# Patient Record
Sex: Male | Born: 1970 | Race: Black or African American | Hispanic: No | Marital: Single
Health system: Southern US, Community
[De-identification: ages and names within clinical notes are randomized; demographics above are authoritative.]

## PROBLEM LIST (undated history)

## (undated) DIAGNOSIS — R569 Unspecified convulsions: Secondary | ICD-10-CM

---

## 2008-09-13 LAB — STREP AG SCREEN, GROUP A: Group A Strep Ag ID: NEGATIVE

## 2008-09-13 MED ORDER — AZITHROMYCIN 250 MG TAB
250 mg | PACK | ORAL | Status: AC
Start: 2008-09-13 — End: 2008-09-18

## 2008-09-13 MED ORDER — CODEINE-GUAIFENESIN 10 MG-100 MG/5 ML ORAL LIQUID
100-10 mg/5 mL | Freq: Three times a day (TID) | ORAL | Status: DC | PRN
Start: 2008-09-13 — End: 2009-02-15

## 2008-09-13 NOTE — ED Notes (Signed)
Report to Terry Verdin, RN.

## 2008-09-13 NOTE — ED Notes (Signed)
I have reviewed discharge instructions with the patient.  The patient verbalized understanding. To follow up with primary physician and return with worsening.

## 2008-09-13 NOTE — ED Provider Notes (Signed)
Patient is a 38 y.o. male presenting with pharyngitis. The history is provided by the patient.   Sore Throat   This is a new problem. The current episode started more than 2 days ago. The problem has not changed since onset. There has been no fever. Associated symptoms include congestion and cough. Pertinent negatives include no diarrhea, no vomiting, no drooling, no ear discharge, no ear pain, no headaches, no plugged ear sensation, no shortness of breath, no stridor, no swollen glands, no trouble swallowing and no stiff neck. He has had no exposure to strep and no exposure to mono. He has tried nothing for the symptoms.        No past medical history on file.     No past surgical history on file.      No family history on file.     History   Social History   ??? Marital Status: Single     Spouse Name: N/A     Number of Children: N/A   ??? Years of Education: N/A   Occupational History   ??? Not on file.   Social History Main Topics   ??? Tobacco Use: Yes   ??? Alcohol Use: Yes   ??? Drug Use: No   ??? Sexually Active:    Other Topics Concern   ??? Not on file   Social History Narrative   ??? No narrative on file           ALLERGIES: Review of patient's allergies indicates no known allergies.      Review of Systems   Constitutional: Negative.  Negative for fever and chills.   HENT: Positive for congestion, sore throat and rhinorrhea. Negative for ear pain, facial swelling, drooling, mouth sores, trouble swallowing, neck pain, neck stiffness and ear discharge.    Eyes: Negative.    Respiratory: Positive for cough. Negative for shortness of breath, wheezing and stridor.    Cardiovascular: Negative.  Negative for chest pain, palpitations and leg swelling.   Gastrointestinal: Negative.  Negative for nausea, vomiting, abdominal pain and diarrhea.   Genitourinary: Negative.    Musculoskeletal: Negative.    Skin: Negative.    Neurological: Negative.  Negative for headaches.   All other systems reviewed and are negative.         Filed Vitals:    09/13/2008  9:21 AM   BP: 123/72   Pulse: 105   Temp: 98.1 ??F (36.7 ??C)   Resp: 18   Height: 5\' 8"  (1.727 m)   Weight: 163 lb (73.936 kg)   SpO2: 96%              Physical Exam   Nursing note and vitals reviewed.  Constitutional: He is oriented. He appears well-developed and well-nourished. No distress.   HENT:   Head: Normocephalic and atraumatic.   Right Ear: External ear normal.   Left Ear: External ear normal.   Nose: Rhinorrhea present.   Mouth/Throat: Mucous membranes are normal. Posterior oropharyngeal erythema present. No oropharyngeal exudate, posterior oropharyngeal edema or tonsillar abscesses.   Cardiovascular: Normal rate, regular rhythm, normal heart sounds and intact distal pulses.    Pulmonary/Chest: Effort normal and breath sounds normal. No respiratory distress. He has no wheezes. He has no rales.   Abdominal: Soft. Bowel sounds are normal. No tenderness.   Musculoskeletal: Normal range of motion.   Neurological: He is alert and oriented.   Skin: Skin is warm and dry. He is not diaphoretic.   Psychiatric: He has  a normal mood and affect. His behavior is normal.            MDM Coding   Reviewed: nursing note and vitals  Interpretation: labs          Procedures

## 2008-09-15 LAB — CULTURE, STREP THROAT

## 2009-02-15 MED ORDER — PROPOXYPHENE N-ACETAMINOPHEN 100 MG-650 MG TAB
100-650 mg | ORAL_TABLET | Freq: Four times a day (QID) | ORAL | Status: AC | PRN
Start: 2009-02-15 — End: 2009-02-22

## 2009-02-15 MED ORDER — CYCLOBENZAPRINE 10 MG TAB
10 mg | ORAL_TABLET | Freq: Three times a day (TID) | ORAL | Status: AC | PRN
Start: 2009-02-15 — End: 2009-02-25

## 2009-02-15 MED ADMIN — ketorolac tromethamine (TORADOL) 60 mg/2 mL injection 60 mg: INTRAMUSCULAR | @ 21:00:00 | NDC 63323016202

## 2009-02-15 MED ADMIN — cyclobenzaprine (FLEXERIL) tablet 10 mg: ORAL | @ 21:00:00 | NDC 00591565801

## 2009-02-15 MED ADMIN — dexamethasone (DECADRON) injection 10 mg: INTRAMUSCULAR | @ 21:00:00 | NDC 00641036721

## 2009-02-15 MED FILL — DEXAMETHASONE SODIUM PHOSPHATE 10 MG/ML IJ SOLN: 10 mg/mL | INTRAMUSCULAR | Qty: 1

## 2009-02-15 MED FILL — CYCLOBENZAPRINE 10 MG TAB: 10 mg | ORAL | Qty: 1

## 2009-02-15 MED FILL — KETOROLAC TROMETHAMINE 60 MG/2 ML IM: 60 mg/2 mL | INTRAMUSCULAR | Qty: 2

## 2009-02-15 NOTE — ED Provider Notes (Signed)
Patient is a 38 y.o. male presenting with back pain. The history is provided by the patient. No language interpreter was used.   Back Pain   This is a new problem. The current episode started 3 to 5 hours ago (five hrs). The problem has not changed since onset. The problem occurs constantly. The pain is associated with no known injury. The pain is present in the right side and lumbar spine. The quality of the pain is described as cramping. The pain does not radiate. The pain is at a severity of 9/10. The symptoms are aggravated by certain positions, twisting and bending. The pain is the same all the time. Pertinent negatives include no fever, no headaches, no abdominal pain, no bowel incontinence, no bladder incontinence, no pelvic pain, no leg pain, no paresthesias, no tingling and no weakness. He has tried bed rest for the symptoms. The treatment provided no relief. Risk factors include a sedentary lifestyle. The patient's surgical history non-contributory        No past medical history on file.     No past surgical history on file.      No family history on file.     History   Social History   ??? Marital Status: Single     Spouse Name: N/A     Number of Children: N/A   ??? Years of Education: N/A   Occupational History   ??? Not on file.   Social History Main Topics   ??? Tobacco Use: Yes   ??? Alcohol Use: Yes   ??? Drug Use: No   ??? Sexually Active:    Other Topics Concern   ??? Not on file   Social History Narrative   ??? No narrative on file           ALLERGIES: Review of patient's allergies indicates no known allergies.      Review of Systems   Constitutional: Negative for fever.   Gastrointestinal: Negative for abdominal pain.   Genitourinary: Negative for bladder incontinence and pelvic pain.   Musculoskeletal: Positive for back pain.   Neurological: Negative for tingling, weakness and headaches.   All other systems reviewed and are negative.        Filed Vitals:    02/15/2009  4:37 PM   BP: 138/88   Pulse: 89    Temp: 96.7 ??F (35.9 ??C)   Resp: 17   Height: 5\' 9"  (1.753 m)   Weight: 165 lb (74.844 kg)              Physical Exam   Nursing note and vitals reviewed.  Constitutional: He is oriented to person, place, and time. He appears well-developed and well-nourished.   HENT:   Head: Normocephalic and atraumatic.   Eyes: Conjunctivae and extraocular motions are normal. Pupils are equal, round, and reactive to light.   Neck: Normal range of motion. Neck supple.   Cardiovascular: Normal rate, regular rhythm, normal heart sounds and intact distal pulses.    Pulmonary/Chest: Effort normal and breath sounds normal.   Abdominal: Soft.   Musculoskeletal: He exhibits tenderness.        Lumbar back: He exhibits tenderness and spasm.   Neurological: He is alert and oriented to person, place, and time.   Skin: Skin is warm and dry.   Psychiatric: He has a normal mood and affect. His behavior is normal. Judgment and thought content normal.        Coding    Procedures

## 2009-02-15 NOTE — ED Notes (Signed)
Medication administered as ordered. Pt tolerated ok.

## 2009-02-17 NOTE — ED Notes (Signed)
I had no direct involvement in the care of this patient.  This signature is to meet administrative requirements only.

## 2009-02-17 NOTE — ED Notes (Signed)
Pt had x-ray, returned to room, family at bedside, waiting for final disposition

## 2009-02-17 NOTE — ED Notes (Signed)
Pt states that he has been having hiccups at work until he feels like he is losing his breath.  Pt noted not to have them at this time.

## 2009-02-17 NOTE — ED Provider Notes (Signed)
Patient is a 38 y.o. male presenting with hiccups. The history is provided by the patient.   Hiccups  This is a new problem. The current episode started 2 days ago. The problem occurs constantly. The problem has not changed since onset. Pertinent negatives include no chest pain, no abdominal pain, no headaches and no shortness of breath. chest wall pain that increases with hiccups . Exacerbated by: hiccups. Nothing relieves the symptoms. He has tried nothing for the symptoms.        No past medical history on file.     No past surgical history on file.      No family history on file.     History   Social History   ??? Marital Status: Married     Spouse Name: N/A     Number of Children: N/A   ??? Years of Education: N/A   Occupational History   ??? Not on file.   Social History Main Topics   ??? Tobacco Use: Yes -- 1.5 packs/day   ??? Alcohol Use: Yes   ??? Drug Use: No   ??? Sexually Active:    Other Topics Concern   ??? Not on file   Social History Narrative   ??? No narrative on file           ALLERGIES: Review of patient's allergies indicates no known allergies.      Review of Systems   Constitutional: Negative.    HENT: Negative.         Hiccups   Eyes: Negative.    Respiratory: Negative.  Negative for shortness of breath.    Cardiovascular: Negative.  Negative for chest pain.   Gastrointestinal: Negative.  Negative for abdominal pain.   Genitourinary: Negative.    Musculoskeletal: Negative.    Skin: Negative.    Neurological: Negative.  Negative for headaches.   Hematological: Negative.    Psychiatric/Behavioral: Negative.        Filed Vitals:    02/17/2009  9:50 PM   BP: 139/104   Pulse: 93   Temp: 99 ??F (37.2 ??C)   Resp: 18   Height: 5\' 9"  (1.753 m)   Weight: 165 lb (74.844 kg)   SpO2: 99%              Physical Exam   Constitutional: He is oriented to person, place, and time. He appears well-developed and well-nourished.   HENT:   Head: Normocephalic and atraumatic.    Eyes: Conjunctivae and extraocular motions are normal. Pupils are equal, round, and reactive to light.   Neck: Normal range of motion. Neck supple.   Cardiovascular: Normal rate, regular rhythm and normal heart sounds.    Pulmonary/Chest: Effort normal and breath sounds normal.       Abdominal: Soft. Bowel sounds are normal.   Musculoskeletal: Normal range of motion.   Neurological: He is alert and oriented to person, place, and time. He has normal reflexes.   Psychiatric: He has a normal mood and affect. His behavior is normal. Judgment and thought content normal.        MDM Coding   Reviewed: nursing note and vitals  Interpretation: labs and x-ray        Procedures    CXR: wnl  Results Include:    Recent Results (from the past 24 hour(s))   CBC WITH AUTOMATED DIFF    Collection Time 02/17/09 10:39 PM   Component Value Range   ??? WBC 6.6  4.0 - 10.5 (K/uL)   ???  RBC 4.48  4.27 - 5.68 (M/uL)   ??? HGB 14.5  12.8 - 16.5 (g/dL)   ??? HCT 41.9  40.4 - 52.1 (%)   ??? MCV 93.5  79.6 - 97.8 (FL)   ??? MCH 32.4  26.1 - 32.9 (PG)   ??? MCHC 34.6  31.4 - 35.0 (g/dL)   ??? RDW 12.8  11.9 - 14.6 (%)   ??? PLATELET 196  140 - 440 (K/uL)   ??? MPV 10.3 (*) 10.8 - 14.1 (FL)   ??? DF AUTOMATED     ??? NEUTROPHILS 45  43 - 78 (%)   ??? LYMPHOCYTES 44  13 - 44 (%)   ??? MONOCYTES 10  4.0 - 12.0 (%)   ??? EOSINOPHILS 1  0.5 - 7.8 (%)   ??? BASOPHILS 0  0.0 - 2.0 (%)   ??? ABSOLUTE NEUTS 3.0  1.7 - 8.2 (K/UL)   ??? ABSOLUTE LYMPHS 2.9  0.5 - 4.6 (K/UL)   ??? ABSOLUTE MONOS 0.6  0.1 - 1.3 (K/UL)   ??? ABSOLUTE EOSINS 0.0  0.0 - 0.8 (K/UL)   ??? ABSOLUTE BASOS 0.0  0.0 - 0.2 (K/UL)   ??? IMM. GRANS. 0.3  0.0 - 2.0 (%)   ??? ABS. IMM. GRANS. 0.0  0.0 - 2.0 (K/UL)   METABOLIC PANEL, COMPREHENSIVE    Collection Time 02/17/09 10:39 PM   Component Value Range   ??? Sodium 144  136 - 145 (MMOL/L)   ??? Potassium 3.5  3.5 - 5.1 (MMOL/L)   ??? Chloride 104  98 - 107 (MMOL/L)   ??? CO2 27  21 - 32 (MMOL/L)   ??? Anion gap 13  7 - 16 (mmol/L)   ??? Glucose 89  74 - 106 (MG/DL)   ??? BUN 8  7 - 18 (MG/DL)    ??? Creatinine 1.1 (*) 0.6 - 1.0 (MG/DL)   ??? GFR est AA >60  >60 (ml/min/1.75m2)   ??? GFR est non-AA >60  >60 (ml/min/1.50m2)   ??? Calcium 8.6  8.4 - 10.4 (MG/DL)   ??? Bilirubin, total 0.3  0.2 - 1.1 (MG/DL)   ??? ALT 78 (*) 39 - 65 (U/L)   ??? AST 41 (*) 15 - 37 (U/L)   ??? Alk. phosphatase 101  50 - 136 (U/L)   ??? Protein, total 7.2  6.3 - 8.2 (g/dL)   ??? Albumin 3.6  3.5 - 5.0 (g/dL)   ??? Globulin 3.6 (*) 2.3 - 3.5 (g/dL)   ??? A-G Ratio 1.0 (*) 1.2 - 3.5 ( )   LIPASE    Collection Time 02/17/09 10:39 PM   Component Value Range   ??? Lipase 241  73 - 393 (U/L)         Pt given reglan  Pt states full relief of sx's    I have discussed the results of labs, procedures, radiographs, treatments as well as any previous results found within the [facility] [employer] with the patient and available family.?? A treatment plan was developed in conjunction with the patient and was agreed upon. The patient is ready for discharge at this time.?? All voiced understanding of the discharge plan and medication instructions or changes as appropriate.?? Questions about treatment in the ED were answered.?? The patient was encouraged to return should symptoms worsen or new problems develop. A follow up physician was provided to the patient on the discharge papers.

## 2009-02-17 NOTE — ED Notes (Signed)
I have reviewed discharge instructions with the patient.  The patient verbalized understanding.

## 2009-02-18 LAB — METABOLIC PANEL, COMPREHENSIVE
A-G Ratio: 1 — ABNORMAL LOW (ref 1.2–3.5)
ALT (SGPT): 78 U/L — ABNORMAL HIGH (ref 39–65)
AST (SGOT): 41 U/L — ABNORMAL HIGH (ref 15–37)
Albumin: 3.6 g/dL (ref 3.5–5.0)
Alk. phosphatase: 101 U/L (ref 50–136)
Anion gap: 13 mmol/L (ref 7–16)
BUN: 8 MG/DL (ref 7–18)
Bilirubin, total: 0.3 MG/DL (ref 0.2–1.1)
CO2: 27 MMOL/L (ref 21–32)
Calcium: 8.6 MG/DL (ref 8.4–10.4)
Chloride: 104 MMOL/L (ref 98–107)
Creatinine: 1.1 MG/DL — ABNORMAL HIGH (ref 0.6–1.0)
GFR est AA: >60 mL/min/{1.73_m2} (ref >60)
GFR est non-AA: >60 mL/min/{1.73_m2} (ref >60)
Globulin: 3.6 g/dL — ABNORMAL HIGH (ref 2.3–3.5)
Glucose: 89 MG/DL (ref 74–106)
Potassium: 3.5 MMOL/L (ref 3.5–5.1)
Protein, total: 7.2 g/dL (ref 6.3–8.2)
Sodium: 144 MMOL/L (ref 136–145)

## 2009-02-18 LAB — CBC WITH AUTOMATED DIFF
ABS. BASOPHILS: 0 10*3/uL (ref 0.0–0.2)
ABS. EOSINOPHILS: 0 10*3/uL (ref 0.0–0.8)
ABS. IMM. GRANS.: 0 10*3/uL (ref 0.0–2.0)
ABS. LYMPHOCYTES: 2.9 10*3/uL (ref 0.5–4.6)
ABS. MONOCYTES: 0.6 10*3/uL (ref 0.1–1.3)
ABS. NEUTROPHILS: 3 10*3/uL (ref 1.7–8.2)
BASOPHILS: 0 % (ref 0.0–2.0)
EOSINOPHILS: 1 % (ref 0.5–7.8)
HCT: 41.9 % (ref 40.4–52.1)
HGB: 14.5 g/dL (ref 12.8–16.5)
IMMATURE GRANULOCYTES: 0.3 % (ref 0.0–2.0)
LYMPHOCYTES: 44 % (ref 13–44)
MCH: 32.4 PG (ref 26.1–32.9)
MCHC: 34.6 g/dL (ref 31.4–35.0)
MCV: 93.5 FL (ref 79.6–97.8)
MONOCYTES: 10 % (ref 4.0–12.0)
MPV: 10.3 FL — ABNORMAL LOW (ref 10.8–14.1)
NEUTROPHILS: 45 % (ref 43–78)
PLATELET: 196 10*3/uL (ref 140–440)
RBC: 4.48 M/uL (ref 4.27–5.68)
RDW: 12.8 % (ref 11.9–14.6)
WBC: 6.6 10*3/uL (ref 4.0–10.5)

## 2009-02-18 LAB — LIPASE: Lipase: 241 U/L (ref 73–393)

## 2009-02-18 MED ORDER — METOCLOPRAMIDE 10 MG TAB
10 mg | ORAL_TABLET | Freq: Four times a day (QID) | ORAL | Status: AC | PRN
Start: 2009-02-18 — End: 2009-02-27

## 2009-02-18 MED ADMIN — metoclopramide (REGLAN) tablet 10 mg: ORAL | @ 03:00:00 | NDC 63739017201

## 2009-02-18 MED FILL — METOCLOPRAMIDE 10 MG TAB: 10 mg | ORAL | Qty: 1

## 2009-11-04 NOTE — ED Notes (Signed)
Patient would not wait to be seen even after he was told MD was coming to see him and had already clicked on chart to come see him next.  Patient walking without any difficulty when he left.

## 2009-11-04 NOTE — ED Notes (Signed)
C/o right knee pain. Seen at [facility] for same and prescribed naprosyn. States naprosyn not relieving pain.

## 2012-02-08 NOTE — ED Notes (Signed)
I have reviewed discharge instructions with the patient.  The patient verbalized understanding.
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
## 2012-02-08 NOTE — ED Provider Notes (Signed)
I was personally available for consultation in the emergency department.  I have reviewed the chart and agree with the documentation recorded by the MLP, including the assessment, treatment plan, and disposition.  Neyland Pettengill M Jasson Siegmann, MD
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
## 2012-02-09 MED ORDER — HYDROCODONE-ACETAMINOPHEN 5 MG-325 MG TAB
5-325 mg | ORAL_TABLET | Freq: Two times a day (BID) | ORAL | Status: AC
Start: 2012-02-09 — End: 2012-02-13

## 2012-02-09 MED ORDER — METHOCARBAMOL 750 MG TAB
750 mg | ORAL_TABLET | Freq: Four times a day (QID) | ORAL | Status: AC
Start: 2012-02-09 — End: 2012-02-13

## 2012-02-09 MED ADMIN — traMADol (ULTRAM) tablet 50 mg: ORAL | @ 03:00:00 | NDC 62584055911

## 2012-02-09 MED FILL — TRAMADOL 50 MG TAB: 50 mg | ORAL | Qty: 1

## 2016-01-24 DIAGNOSIS — L089 Local infection of the skin and subcutaneous tissue, unspecified: Secondary | ICD-10-CM

## 2016-01-24 NOTE — ED Notes (Signed)
I have reviewed discharge instructions with the patient.  The patient verbalized understanding.

## 2016-01-24 NOTE — ED Provider Notes (Addendum)
Patient is a 45 y.o. male presenting with finger pain. The history is provided by the patient.   Finger Pain    This is a new problem. The current episode started more than 1 week ago. The problem occurs constantly. The problem has been gradually worsening. The pain is present in the left fingers and left upper leg. The pain is at a severity of 0/10. The patient is experiencing no pain. He has tried nothing for the symptoms. There has been a history of trauma (table saw injury).        History reviewed. No pertinent past medical history.    History reviewed. No pertinent surgical history.      History reviewed. No pertinent family history.    Social History     Social History   ??? Marital status: MARRIED     Spouse name: N/A   ??? Number of children: N/A   ??? Years of education: N/A     Occupational History   ??? Not on file.     Social History Main Topics   ??? Smoking status: Current Every Day Smoker     Packs/day: 1.00     Years: 20.00   ??? Smokeless tobacco: Not on file   ??? Alcohol use Yes      Comment: social- a lot of Vodka tonight   ??? Drug use: No   ??? Sexual activity: Yes     Partners: Female     Other Topics Concern   ??? Not on file     Social History Narrative         ALLERGIES: Review of patient's allergies indicates no known allergies.    Review of Systems   Constitutional: Negative.    HENT: Negative.    Respiratory: Negative.    Cardiovascular: Negative.    Gastrointestinal: Negative.    Endocrine: Negative.    Genitourinary: Negative.    Musculoskeletal: Negative.    Skin: Negative.    Neurological: Negative.    Hematological: Negative.        Vitals:    01/24/16 2155   BP: 152/73   Pulse: (!) 113   Resp: 20   Temp: 98.5 ??F (36.9 ??C)   SpO2: 97%   Weight: 74.8 kg (165 lb)   Height: 5\' 9"  (1.753 m)            Physical Exam   Constitutional: He is oriented to person, place, and time. He appears well-developed and well-nourished.   HENT:   Head: Normocephalic and atraumatic.    Cardiovascular: Intact distal pulses.    Pulmonary/Chest: Effort normal.   Abdominal: Soft.   Neurological: He is alert and oriented to person, place, and time.   Skin: Skin is warm and dry.   Psychiatric: He has a normal mood and affect. His behavior is normal.   Nursing note and vitals reviewed.       MDM  Number of Diagnoses or Management Options  Finger infection: new and requires workup  Diagnosis management comments: Patient has a healing area over the lateral aspect of the left fifth digit.  He states that he was cut initially by a table saw and cleaned it copiously with water and hydrogen peroxide.  In the last 24-48 hours he has noticed some yellow drainage from the scab was concerned about becoming infected.  The digit has full range of motion with no pain.  There is minimal drainage present.  No swelling noted.  Discussed with patient due to  the potential contamination from a table saw, which are placed on metabolic therapy and should continue to clean daily and use antibiotic over the wound and not use any more peroxide or volatile agents to dry the wound as this may delay healing.    ED Course       Procedures
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
## 2016-01-25 ENCOUNTER — Inpatient Hospital Stay
Admit: 2016-01-25 | Discharge: 2016-01-25 | Disposition: A | Discharge status: Home / Self Care | Payer: Self-pay | Attending: Emergency Medicine

## 2016-01-25 MED ORDER — DIPHTH,PERTUS(AC)TETANUS VAC(PF) 2.5 LF UNIT-8 MCG-5 LF/0.5 ML INJ
INTRAMUSCULAR | Status: AC
Start: 2016-01-25 — End: 2016-01-24
  Administered 2016-01-25: 04:00:00 via INTRAMUSCULAR

## 2016-01-25 MED ORDER — CEPHALEXIN 500 MG CAP
500 mg | ORAL | Status: AC
Start: 2016-01-25 — End: 2016-01-24
  Administered 2016-01-25: 04:00:00 via ORAL

## 2016-01-25 MED ORDER — CEPHALEXIN 500 MG CAP
500 mg | ORAL_CAPSULE | Freq: Four times a day (QID) | ORAL | 0 refills | Status: AC
Start: 2016-01-25 — End: 2016-01-31

## 2016-01-25 MED FILL — CEPHALEXIN 500 MG CAP: 500 mg | ORAL | Qty: 1

## 2016-01-25 MED FILL — BOOSTRIX TDAP 2.5 LF UNIT-8 MCG-5 LF/0.5 ML INTRAMUSCULAR SUSPENSION: INTRAMUSCULAR | Qty: 1

## 2018-08-05 ENCOUNTER — Inpatient Hospital Stay
Admit: 2018-08-05 | Discharge: 2018-08-05 | Disposition: A | Discharge status: Home / Self Care | Payer: Self-pay | Attending: Emergency Medicine

## 2018-08-05 ENCOUNTER — Emergency Department: Admit: 2018-08-05 | Payer: Self-pay

## 2018-08-05 DIAGNOSIS — R569 Unspecified convulsions: Secondary | ICD-10-CM

## 2018-08-05 LAB — CBC WITH AUTO DIFFERENTIAL
Basophils %: 1 % (ref 0.0–2.0)
Basophils Absolute: 0 10*3/uL (ref 0.0–0.2)
Eosinophils %: 2 % (ref 0.5–7.8)
Eosinophils Absolute: 0.1 10*3/uL (ref 0.0–0.8)
Granulocyte Absolute Count: 0 10*3/uL (ref 0.0–0.5)
Hematocrit: 43.5 % (ref 41.1–50.3)
Hemoglobin: 14.8 g/dL (ref 13.6–17.2)
Immature Granulocytes: 1 % (ref 0.0–5.0)
Lymphocytes %: 34 % (ref 13–44)
Lymphocytes Absolute: 1.4 10*3/uL (ref 0.5–4.6)
MCH: 34.2 PG — ABNORMAL HIGH (ref 26.1–32.9)
MCHC: 34 g/dL (ref 31.4–35.0)
MCV: 100.5 FL — ABNORMAL HIGH (ref 79.6–97.8)
MPV: 9.7 FL (ref 9.4–12.3)
Monocytes %: 12 % (ref 4.0–12.0)
Monocytes Absolute: 0.5 10*3/uL (ref 0.1–1.3)
NRBC Absolute: 0 10*3/uL (ref 0.0–0.2)
Neutrophils %: 51 % (ref 43–78)
Neutrophils Absolute: 2.1 10*3/uL (ref 1.7–8.2)
Platelets: 212 10*3/uL (ref 150–450)
RBC: 4.33 M/uL (ref 4.23–5.6)
RDW: 12.2 % (ref 11.9–14.6)
WBC: 4.1 10*3/uL — ABNORMAL LOW (ref 4.3–11.1)

## 2018-08-05 LAB — DRUG SCREEN, URINE
AMPHETAMINES: NEGATIVE
Amphetamine Screen, Urine: NEGATIVE
BARBITURATES: NEGATIVE
BENZODIAZEPINES: NEGATIVE
Barbiturate Screen, Urine: NEGATIVE
Benzodiazepine Screen, Urine: NEGATIVE
COCAINE: NEGATIVE
Cocaine Screen Urine: NEGATIVE
METHADONE: NEGATIVE
Methadone Screen, Urine: NEGATIVE
OPIATES: NEGATIVE
Opiate Screen, Urine: NEGATIVE
PCP Screen, Urine: NEGATIVE
PCP(PHENCYCLIDINE): NEGATIVE
THC (TH-CANNABINOL): NEGATIVE
THC Screen, Urine: NEGATIVE

## 2018-08-05 LAB — COMPREHENSIVE METABOLIC PANEL
ALT: 152 U/L — ABNORMAL HIGH (ref 12–65)
AST: 156 U/L — ABNORMAL HIGH (ref 15–37)
Albumin/Globulin Ratio: 0.8 — ABNORMAL LOW (ref 1.2–3.5)
Albumin: 3.7 g/dL (ref 3.5–5.0)
Alkaline Phosphatase: 146 U/L — ABNORMAL HIGH (ref 50–136)
Anion Gap: 11 mmol/L (ref 7–16)
BUN: 12 MG/DL (ref 6–23)
CO2: 27 mmol/L (ref 21–32)
Calcium: 9.5 MG/DL (ref 8.3–10.4)
Chloride: 102 mmol/L (ref 98–107)
Creatinine: 0.95 MG/DL (ref 0.8–1.5)
EGFR IF NonAfrican American: >60 mL/min/{1.73_m2} (ref >60)
GFR African American: >60 mL/min/{1.73_m2} (ref >60)
Globulin: 4.8 g/dL — ABNORMAL HIGH (ref 2.3–3.5)
Glucose: 86 mg/dL (ref 65–100)
Potassium: 3.8 mmol/L (ref 3.5–5.1)
Sodium: 140 mmol/L (ref 136–145)
Total Bilirubin: 1.2 MG/DL — ABNORMAL HIGH (ref 0.2–1.1)
Total Protein: 8.5 g/dL — ABNORMAL HIGH (ref 6.3–8.2)

## 2018-08-05 LAB — EKG 12-LEAD
Atrial Rate: 86 {beats}/min
P Axis: 70 degrees
P-R Interval: 144 ms
Q-T Interval: 370 ms
QRS Duration: 94 ms
QTc Calculation (Bazett): 442 ms
R Axis: 57 degrees
T Axis: 43 degrees
Ventricular Rate: 86 {beats}/min

## 2018-08-05 LAB — ETHYL ALCOHOL
ALCOHOL(ETHYL),SERUM: <3 MG/DL
Ethyl Alcohol: <3 MG/DL

## 2018-08-05 LAB — MAGNESIUM
Magnesium: 1.9 mg/dL (ref 1.8–2.4)
Magnesium: 1.9 mg/dL (ref 1.8–2.4)

## 2018-08-05 LAB — CBC WITH AUTOMATED DIFF
ABS. BASOPHILS: 0 10*3/uL (ref 0.0–0.2)
ABS. EOSINOPHILS: 0.1 10*3/uL (ref 0.0–0.8)
ABS. IMM. GRANS.: 0 10*3/uL (ref 0.0–0.5)
ABS. LYMPHOCYTES: 1.4 10*3/uL (ref 0.5–4.6)
ABS. MONOCYTES: 0.5 10*3/uL (ref 0.1–1.3)
ABS. NEUTROPHILS: 2.1 10*3/uL (ref 1.7–8.2)
ABSOLUTE NRBC: 0 10*3/uL (ref 0.0–0.2)
BASOPHILS: 1 % (ref 0.0–2.0)
EOSINOPHILS: 2 % (ref 0.5–7.8)
HCT: 43.5 % (ref 41.1–50.3)
HGB: 14.8 g/dL (ref 13.6–17.2)
IMMATURE GRANULOCYTES: 1 % (ref 0.0–5.0)
LYMPHOCYTES: 34 % (ref 13–44)
MCH: 34.2 PG — ABNORMAL HIGH (ref 26.1–32.9)
MCHC: 34 g/dL (ref 31.4–35.0)
MCV: 100.5 FL — ABNORMAL HIGH (ref 79.6–97.8)
MONOCYTES: 12 % (ref 4.0–12.0)
MPV: 9.7 FL (ref 9.4–12.3)
NEUTROPHILS: 51 % (ref 43–78)
PLATELET: 212 10*3/uL (ref 150–450)
RBC: 4.33 M/uL (ref 4.23–5.6)
RDW: 12.2 % (ref 11.9–14.6)
WBC: 4.1 10*3/uL — ABNORMAL LOW (ref 4.3–11.1)

## 2018-08-05 LAB — EKG, 12 LEAD, INITIAL
Atrial Rate: 86 {beats}/min
Calculated P Axis: 70 degrees
Calculated R Axis: 57 degrees
Calculated T Axis: 43 degrees
P-R Interval: 144 ms
Q-T Interval: 370 ms
QRS Duration: 94 ms
QTC Calculation (Bezet): 442 ms
Ventricular Rate: 86 {beats}/min

## 2018-08-05 LAB — METABOLIC PANEL, COMPREHENSIVE
A-G Ratio: 0.8 — ABNORMAL LOW (ref 1.2–3.5)
ALT (SGPT): 152 U/L — ABNORMAL HIGH (ref 12–65)
AST (SGOT): 156 U/L — ABNORMAL HIGH (ref 15–37)
Albumin: 3.7 g/dL (ref 3.5–5.0)
Alk. phosphatase: 146 U/L — ABNORMAL HIGH (ref 50–136)
Anion gap: 11 mmol/L (ref 7–16)
BUN: 12 MG/DL (ref 6–23)
Bilirubin, total: 1.2 MG/DL — ABNORMAL HIGH (ref 0.2–1.1)
CO2: 27 mmol/L (ref 21–32)
Calcium: 9.5 MG/DL (ref 8.3–10.4)
Chloride: 102 mmol/L (ref 98–107)
Creatinine: 0.95 MG/DL (ref 0.8–1.5)
GFR est AA: >60 mL/min/{1.73_m2} (ref >60)
GFR est non-AA: >60 mL/min/{1.73_m2} (ref >60)
Globulin: 4.8 g/dL — ABNORMAL HIGH (ref 2.3–3.5)
Glucose: 86 mg/dL (ref 65–100)
Potassium: 3.8 mmol/L (ref 3.5–5.1)
Protein, total: 8.5 g/dL — ABNORMAL HIGH (ref 6.3–8.2)
Sodium: 140 mmol/L (ref 136–145)

## 2018-08-05 MED ORDER — LEVETIRACETAM 500 MG TAB
500 mg | ORAL | Status: AC
Start: 2018-08-05 — End: 2018-08-05
  Administered 2018-08-05: 12:00:00 via ORAL

## 2018-08-05 MED ORDER — SODIUM CHLORIDE 0.9 % IJ SYRG
INTRAMUSCULAR | Status: DC | PRN
Start: 2018-08-05 — End: 2018-08-05

## 2018-08-05 MED ORDER — SODIUM CHLORIDE 0.9 % IJ SYRG
Freq: Three times a day (TID) | INTRAMUSCULAR | Status: DC
Start: 2018-08-05 — End: 2018-08-05

## 2018-08-05 MED ORDER — LEVETIRACETAM 500 MG TAB
500 mg | ORAL_TABLET | Freq: Two times a day (BID) | ORAL | 1 refills | Status: AC
Start: 2018-08-05 — End: ?

## 2018-08-05 MED FILL — LEVETIRACETAM 500 MG TAB: 500 mg | ORAL | Qty: 1

## 2018-08-05 NOTE — ED Provider Notes (Signed)
Per nurse's notes: "Pt's sister states to ems that she heard pt fall and then witnessed him having tonic-clonic seizure activity. Pt daily etoh drinker. No hx of seizures however states it runs in their family. Bruising to left side of forehead, has bitten tongue, abrasion to r hand. Oriented to person and place but not year."    The history is provided by the patient and the EMS personnel. The history is limited by the condition of the patient.   Seizure    This is a new problem. The current episode started less than 1 hour ago. The problem has been resolved. There was 1 seizure. Duration: Unknown. Pertinent negatives include no confusion, no headaches, no speech difficulty, no visual disturbance, no neck stiffness, no sore throat, no chest pain, no cough, no vomiting, no diarrhea and no muscle weakness. Characteristics include rhythmic jerking, loss of consciousness and bit tongue. Characteristics do not include eye blinking, eye deviation, bowel incontinence, bladder incontinence, apnea or cyanosis. The episode was witnessed. There was no sensation of an aura present.  The seizures did not continue in the ED. The seizure(s) had no focality. Possible causes do not include medication or dosage change, sleep deprivation, missed seizure meds, recent illness, change in alcohol use, stress, head injury or missed seizure meds. There has been no fever.  He reports no chest pain, no confusion, no visual disturbance, no diarrhea, no vomiting, no headaches, no sore throat, no muscle weakness, no stiff neck, no speech difficulty, and no cough. There were no medications administered prior to arrival. Home seizure medications include: no seizure medications.       No past medical history on file.    No past surgical history on file.      No family history on file.    Social History     Socioeconomic History   ??? Marital status: MARRIED     Spouse name: Not on file   ??? Number of children: Not on file    ??? Years of education: Not on file   ??? Highest education level: Not on file   Occupational History   ??? Not on file   Social Needs   ??? Financial resource strain: Not on file   ??? Food insecurity:     Worry: Not on file     Inability: Not on file   ??? Transportation needs:     Medical: Not on file     Non-medical: Not on file   Tobacco Use   ??? Smoking status: Current Every Day Smoker     Packs/day: 1.00     Years: 20.00     Pack years: 20.00   Substance and Sexual Activity   ??? Alcohol use: Yes     Comment: social- a lot of Vodka tonight   ??? Drug use: No   ??? Sexual activity: Yes     Partners: Female   Lifestyle   ??? Physical activity:     Days per week: Not on file     Minutes per session: Not on file   ??? Stress: Not on file   Relationships   ??? Social connections:     Talks on phone: Not on file     Gets together: Not on file     Attends religious service: Not on file     Active member of club or organization: Not on file     Attends meetings of clubs or organizations: Not on file     Relationship  status: Not on file   ??? Intimate partner violence:     Fear of current or ex partner: Not on file     Emotionally abused: Not on file     Physically abused: Not on file     Forced sexual activity: Not on file   Other Topics Concern   ??? Not on file   Social History Narrative   ??? Not on file         ALLERGIES: Patient has no known allergies.    Review of Systems   HENT: Negative for sore throat.    Eyes: Negative for visual disturbance.   Respiratory: Negative for apnea and cough.    Cardiovascular: Negative for chest pain and cyanosis.   Gastrointestinal: Negative for bowel incontinence, diarrhea and vomiting.   Genitourinary: Negative for bladder incontinence.   Neurological: Positive for loss of consciousness. Negative for speech difficulty and headaches.   Psychiatric/Behavioral: Negative for confusion.   All other systems reviewed and are negative.      Vitals:    08/05/18 0505   BP: 145/88   Pulse: 85   Resp: 20    Temp: 98.6 ??F (37 ??C)   SpO2: 96%   Weight: 72.6 kg (160 lb)   Height: '5\' 9"'  (1.753 m)            Physical Exam  Vitals signs and nursing note reviewed.   Constitutional:       General: He is not in acute distress.     Appearance: He is well-developed.   HENT:      Head: Normocephalic and atraumatic.      Jaw: There is normal jaw occlusion.      Right Ear: External ear normal.      Left Ear: External ear normal.      Nose: Nose normal.      Mouth/Throat:      Mouth: Mucous membranes are moist. Injury (Small laceration to the center of the tongue, well approximated) present.      Pharynx: Oropharynx is clear. Uvula midline.     Eyes:      Conjunctiva/sclera: Conjunctivae normal.      Pupils: Pupils are equal, round, and reactive to light.   Neck:      Musculoskeletal: Normal range of motion and neck supple.   Cardiovascular:      Rate and Rhythm: Normal rate and regular rhythm.      Heart sounds: Normal heart sounds.   Pulmonary:      Effort: Pulmonary effort is normal.      Breath sounds: Normal breath sounds.   Abdominal:      General: Bowel sounds are normal.      Palpations: Abdomen is soft.      Tenderness: There is no tenderness.   Musculoskeletal: Normal range of motion.   Skin:     General: Skin is warm and dry.      Capillary Refill: Capillary refill takes less than 2 seconds.   Neurological:      Mental Status: He is alert.      Cranial Nerves: Cranial nerves are intact.      Sensory: Sensation is intact.      Motor: Motor function is intact.      Coordination: Coordination is intact.      Deep Tendon Reflexes: Reflexes normal.      Comments: Patient oriented to person and place, does not know the year.   Psychiatric:  Mood and Affect: Mood and affect normal.         Speech: Speech normal.         Behavior: Behavior is slowed.         Thought Content: Thought content normal.          MDM  Number of Diagnoses or Management Options     Amount and/or Complexity of Data Reviewed   Clinical lab tests: ordered and reviewed  Tests in the radiology section of CPT??: ordered and reviewed  Review and summarize past medical records: yes  Independent visualization of images, tracings, or specimens: yes    Risk of Complications, Morbidity, and/or Mortality  Presenting problems: high  Diagnostic procedures: moderate  Management options: moderate    Patient Progress  Patient progress: stable         Procedures    The patient was observed in the ED. no further seizures while in the emergency department.  Patient gives history of 3-4 beers a day and did drink yesterday.  He has no other symptoms to go along with withdrawal, and a family history of seizure disorder.  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  Patient was instructed to avoid driving or using a bathtub until cleared by neurology.    Results Reviewed:      Recent Results (from the past 24 hour(s))   CBC WITH AUTOMATED DIFF    Collection Time: 08/05/18  5:11 AM   Result Value Ref Range    WBC 4.1 (L) 4.3 - 11.1 K/uL    RBC 4.33 4.23 - 5.6 M/uL    HGB 14.8 13.6 - 17.2 g/dL    HCT 43.5 41.1 - 50.3 %    MCV 100.5 (H) 79.6 - 97.8 FL    MCH 34.2 (H) 26.1 - 32.9 PG    MCHC 34.0 31.4 - 35.0 g/dL    RDW 12.2 11.9 - 14.6 %    PLATELET 212 150 - 450 K/uL    MPV 9.7 9.4 - 12.3 FL    ABSOLUTE NRBC 0.00 0.0 - 0.2 K/uL    DF AUTOMATED      NEUTROPHILS 51 43 - 78 %    LYMPHOCYTES 34 13 - 44 %    MONOCYTES 12 4.0 - 12.0 %    EOSINOPHILS 2 0.5 - 7.8 %    BASOPHILS 1 0.0 - 2.0 %    IMMATURE GRANULOCYTES 1 0.0 - 5.0 %    ABS. NEUTROPHILS 2.1 1.7 - 8.2 K/UL    ABS. LYMPHOCYTES 1.4 0.5 - 4.6 K/UL    ABS. MONOCYTES 0.5 0.1 - 1.3 K/UL    ABS. EOSINOPHILS 0.1 0.0 - 0.8 K/UL    ABS. BASOPHILS 0.0 0.0 - 0.2 K/UL    ABS. IMM. GRANS. 0.0 0.0 - 0.5 K/UL   METABOLIC PANEL, COMPREHENSIVE    Collection Time: 08/05/18  5:11 AM   Result Value Ref Range    Sodium 140 136 - 145 mmol/L     Potassium 3.8 3.5 - 5.1 mmol/L    Chloride 102 98 - 107 mmol/L    CO2 27 21 - 32 mmol/L    Anion gap 11 7 - 16 mmol/L    Glucose 86 65 - 100 mg/dL    BUN 12 6 - 23 MG/DL    Creatinine 0.95 0.8 - 1.5 MG/DL    GFR est AA >60 >60 ml/min/1.110m  GFR est non-AA >60 >60 ml/min/1.59m    Calcium 9.5 8.3 - 10.4 MG/DL    Bilirubin, total 1.2 (H) 0.2 - 1.1 MG/DL    ALT (SGPT) 152 (H) 12 - 65 U/L    AST (SGOT) 156 (H) 15 - 37 U/L    Alk. phosphatase 146 (H) 50 - 136 U/L    Protein, total 8.5 (H) 6.3 - 8.2 g/dL    Albumin 3.7 3.5 - 5.0 g/dL    Globulin 4.8 (H) 2.3 - 3.5 g/dL    A-G Ratio 0.8 (L) 1.2 - 3.5     MAGNESIUM    Collection Time: 08/05/18  5:11 AM   Result Value Ref Range    Magnesium 1.9 1.8 - 2.4 mg/dL   ETHYL ALCOHOL    Collection Time: 08/05/18  5:11 AM   Result Value Ref Range    ALCOHOL(ETHYL),SERUM <3 MG/DL   DRUG SCREEN, URINE    Collection Time: 08/05/18  5:26 AM   Result Value Ref Range    PCP(PHENCYCLIDINE) NEGATIVE       BENZODIAZEPINES NEGATIVE       COCAINE NEGATIVE       AMPHETAMINES NEGATIVE       METHADONE NEGATIVE       THC (TH-CANNABINOL) NEGATIVE       OPIATES NEGATIVE       BARBITURATES NEGATIVE        CT HEAD WO CONT   Final Result   IMPRESSION:      Unremarkable brain CT without intravenous contrast.      Date of Dictation: 08/05/2018 5:45 AM             I discussed the results of all labs, procedures, radiographs, and treatments with the patient and available family.  Treatment plan is agreed upon and the patient is ready for discharge.  All voiced understanding of the discharge plan and medication instructions or changes as appropriate.  Questions about treatment in the ED were answered.  All were encouraged to return should symptoms worsen or new problems develop.

## 2018-08-05 NOTE — ED Notes (Signed)
Pt's sister states to ems that she heard pt fall and then witnessed him having tonic-clonic seizure activity. Pt daily etoh drinker. No hx of seizures however states it runs in their family. Bruising to left side of forehead, has bitten tongue, abrasion to r hand. Oriented to person and place but not year.

## 2018-08-05 NOTE — ED Provider Notes (Signed)
ED Provider Notes by Daron Offer, MD at 08/05/18 0507                Author: Daron Offer, MD  Service: --  Author Type: Physician       Filed: 08/05/18 0624  Date of Service: 08/05/18 0507  Status: Signed          Editor: Daron Offer, MD (Physician)               Per nurse's notes: "Pt's sister states to ems that she heard pt fall and then witnessed him having tonic-clonic  seizure activity. Pt daily etoh drinker. No hx of seizures however states it runs in their family. Bruising to left side of forehead, has bitten tongue, abrasion to r hand. Oriented to person and place but not year."      The history is provided by the patient and the EMS personnel. The history is limited by the condition of the patient.    Seizure     This is a new problem. The  current episode started less than 1 hour ago. The problem  has been resolved. There was 1 seizure. Duration: Unknown. Pertinent negatives include no confusion, no headaches, no speech difficulty, no visual disturbance, no neck stiffness, no sore throat, no chest pain, no cough, no vomiting, no diarrhea and no muscle weakness. Characteristics include rhythmic jerking, loss of consciousness and bit tongue.  Characteristics do not include eye blinking, eye deviation, bowel incontinence, bladder incontinence, apnea or cyanosis. The episode was witnessed.  There was no sensation of an aura present.   The seizures did not continue in the ED. The  seizure(s) had no focality. Possible causes do  not include medication or dosage change, sleep deprivation, missed seizure meds, recent illness, change in alcohol use, stress, head injury or missed seizure meds. There has been no fever.  He reports no chest pain, no confusion, no visual disturbance, no diarrhea,  no vomiting, no headaches, no sore throat, no muscle weakness, no stiff neck, no speech difficulty, and no cough. There were no medications  administered prior to arrival. Home seizure medications   include: no seizure medications.          No past medical history on file.      No past surgical history on file.        No family history on file.        Social History          Socioeconomic History         ?  Marital status:  MARRIED              Spouse name:  Not on file         ?  Number of children:  Not on file     ?  Years of education:  Not on file     ?  Highest education level:  Not on file       Occupational History        ?  Not on file       Social Needs         ?  Financial resource strain:  Not on file        ?  Food insecurity:              Worry:  Not on file         Inability:  Not on file        ?  Transportation needs:              Medical:  Not on file         Non-medical:  Not on file       Tobacco Use         ?  Smoking status:  Current Every Day Smoker              Packs/day:  1.00         Years:  20.00         Pack years:  20.00       Substance and Sexual Activity         ?  Alcohol use:  Yes             Comment: social- a lot of Vodka tonight         ?  Drug use:  No     ?  Sexual activity:  Yes              Partners:  Female       Lifestyle        ?  Physical activity:              Days per week:  Not on file         Minutes per session:  Not on file         ?  Stress:  Not on file       Relationships        ?  Social connections:              Talks on phone:  Not on file         Gets together:  Not on file         Attends religious service:  Not on file         Active member of club or organization:  Not on file         Attends meetings of clubs or organizations:  Not on file         Relationship status:  Not on file        ?  Intimate partner violence:              Fear of current or ex partner:  Not on file         Emotionally abused:  Not on file         Physically abused:  Not on file         Forced sexual activity:  Not on file        Other Topics  Concern        ?  Not on file       Social History Narrative        ?  Not on file              ALLERGIES: Patient has no known  allergies.      Review of Systems    HENT: Negative for sore throat.     Eyes: Negative for visual disturbance.    Respiratory: Negative for apnea and cough.     Cardiovascular: Negative for chest pain and cyanosis.    Gastrointestinal: Negative for bowel incontinence, diarrhea and vomiting.    Genitourinary: Negative for bladder incontinence.    Neurological: Positive for loss of consciousness. Negative for speech difficulty and headaches.    Psychiatric/Behavioral: Negative for confusion.    All other systems reviewed  and are negative.           Vitals:          08/05/18 0505        BP:  145/88     Pulse:  85     Resp:  20     Temp:  98.6 ??F (37 ??C)     SpO2:  96%     Weight:  72.6 kg (160 lb)        Height:  '5\' 9"'$  (1.753 m)                Physical Exam   Vitals signs and nursing note reviewed.   Constitutional:        General: He is not in acute distress.     Appearance: He is well-developed.    HENT:       Head: Normocephalic and atraumatic.      Jaw: There is normal jaw occlusion.      Right Ear: External ear normal.      Left Ear: External ear normal.      Nose: Nose normal.      Mouth/Throat:      Mouth: Mucous membranes are  moist. Injury (Small laceration to the center of the tongue, well approximated)  present.      Pharynx: Oropharynx is clear. Uvula midline.       Eyes :       Conjunctiva/sclera: Conjunctivae normal.      Pupils: Pupils are equal, round, and reactive to light.    Neck:       Musculoskeletal: Normal range of motion and neck supple.   Cardiovascular :       Rate and Rhythm: Normal rate and regular rhythm.      Heart sounds: Normal heart sounds.    Pulmonary:       Effort: Pulmonary effort is normal.      Breath sounds: Normal breath sounds.   Abdominal :      General: Bowel sounds are normal.      Palpations: Abdomen is soft.      Tenderness: There is no tenderness.     Musculoskeletal: Normal range of motion.    Skin:      General: Skin is warm and dry.      Capillary Refill: Capillary  refill takes less than 2 seconds.    Neurological:       Mental Status: He is alert.      Cranial Nerves: Cranial nerves are intact.      Sensory: Sensation is intact.      Motor: Motor function is intact.      Coordination: Coordination is intact.      Deep Tendon Reflexes: Reflexes  normal.      Comments: Patient oriented to person and place, does not know the year.   Psychiatric:         Mood and Affect: Mood and affect  normal.         Speech: Speech normal.         Behavior: Behavior is slowed.         Thought Content: Thought content normal.              MDM   Number of Diagnoses or Management Options       Amount and/or Complexity of Data Reviewed   Clinical lab tests: ordered and reviewed   Tests in the radiology section of CPT??: ordered and reviewed  Review and summarize past medical records: yes    Independent visualization of images, tracings, or specimens: yes      Risk of Complications, Morbidity, and/or Mortality   Presenting problems: high  Diagnostic procedures: moderate  Management options: moderate     Patient Progress   Patient progress: stable             Procedures      The patient was observed in the ED. no further seizures while in the emergency department.  Patient gives history of 3-4 beers a day and did drink yesterday.  He has no other symptoms to go along with withdrawal, and a family history of seizure disorder.   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  Patient was instructed to avoid driving or using a bathtub until cleared by neurology.      Results Reviewed:           Recent Results (from the past 24 hour(s))     CBC WITH AUTOMATED DIFF          Collection Time: 08/05/18  5:11 AM         Result  Value  Ref Range            WBC  4.1 (L)  4.3 - 11.1 K/uL       RBC  4.33  4.23 - 5.6 M/uL       HGB  14.8  13.6 - 17.2 g/dL       HCT  43.5  41.1 - 50.3 %       MCV  100.5 (H)  79.6 - 97.8 FL        MCH  34.2 (H)  26.1 - 32.9 PG       MCHC  34.0  31.4 - 35.0 g/dL       RDW  12.2  11.9 - 14.6 %       PLATELET  212  150 - 450 K/uL       MPV  9.7  9.4 - 12.3 FL       ABSOLUTE NRBC  0.00  0.0 - 0.2 K/uL       DF  AUTOMATED          NEUTROPHILS  51  43 - 78 %       LYMPHOCYTES  34  13 - 44 %       MONOCYTES  12  4.0 - 12.0 %       EOSINOPHILS  2  0.5 - 7.8 %       BASOPHILS  1  0.0 - 2.0 %       IMMATURE GRANULOCYTES  1  0.0 - 5.0 %       ABS. NEUTROPHILS  2.1  1.7 - 8.2 K/UL       ABS. LYMPHOCYTES  1.4  0.5 - 4.6 K/UL       ABS. MONOCYTES  0.5  0.1 - 1.3 K/UL       ABS. EOSINOPHILS  0.1  0.0 - 0.8 K/UL       ABS. BASOPHILS  0.0  0.0 - 0.2 K/UL       ABS. IMM. GRANS.  0.0  0.0 - 0.5 K/UL       METABOLIC PANEL, COMPREHENSIVE          Collection Time: 08/05/18  5:11 AM  Result  Value  Ref Range            Sodium  140  136 - 145 mmol/L       Potassium  3.8  3.5 - 5.1 mmol/L       Chloride  102  98 - 107 mmol/L       CO2  27  21 - 32 mmol/L       Anion gap  11  7 - 16 mmol/L       Glucose  86  65 - 100 mg/dL       BUN  12  6 - 23 MG/DL       Creatinine  0.95  0.8 - 1.5 MG/DL       GFR est AA  >60  >60 ml/min/1.35m            GFR est non-AA  >60  >60 ml/min/1.782m           Calcium  9.5  8.3 - 10.4 MG/DL       Bilirubin, total  1.2 (H)  0.2 - 1.1 MG/DL       ALT (SGPT)  152 (H)  12 - 65 U/L       AST (SGOT)  156 (H)  15 - 37 U/L       Alk. phosphatase  146 (H)  50 - 136 U/L       Protein, total  8.5 (H)  6.3 - 8.2 g/dL       Albumin  3.7  3.5 - 5.0 g/dL       Globulin  4.8 (H)  2.3 - 3.5 g/dL       A-G Ratio  0.8 (L)  1.2 - 3.5         MAGNESIUM          Collection Time: 08/05/18  5:11 AM         Result  Value  Ref Range            Magnesium  1.9  1.8 - 2.4 mg/dL       ETHYL ALCOHOL          Collection Time: 08/05/18  5:11 AM         Result  Value  Ref Range            ALCOHOL(ETHYL),SERUM  <3  MG/DL       DRUG SCREEN, URINE          Collection Time: 08/05/18  5:26 AM         Result  Value  Ref Range             PCP(PHENCYCLIDINE)  NEGATIVE           BENZODIAZEPINES  NEGATIVE           COCAINE  NEGATIVE           AMPHETAMINES  NEGATIVE           METHADONE  NEGATIVE           THC (TH-CANNABINOL)  NEGATIVE           OPIATES  NEGATIVE                BARBITURATES  NEGATIVE              CT HEAD WO CONT       Final Result     IMPRESSION:          Unremarkable  brain CT without intravenous contrast.          Date of Dictation: 08/05/2018 5:45 AM                       I discussed the results of all labs, procedures, radiographs, and treatments with the patient and available family.  Treatment plan is agreed upon and the patient is ready for discharge.  All voiced understanding of the discharge plan and medication instructions  or changes as appropriate.  Questions about treatment in the ED were answered.  All were encouraged to return should symptoms worsen or new problems develop.

## 2018-08-05 NOTE — ED Triage Notes (Signed)
Pt's sister states to ems that she heard pt fall and then witnessed him having tonic-clonic seizure activity. Pt daily etoh drinker. No hx of seizures however states it runs in their family. Bruising to left side of forehead, has bitten tongue, abrasion to r hand. Oriented to person and place but not year.

## 2018-09-07 ENCOUNTER — Inpatient Hospital Stay
Admit: 2018-09-07 | Discharge: 2018-09-09 | Disposition: A | Discharge status: Home / Self Care | Payer: Self-pay | Attending: Internal Medicine | Admitting: Internal Medicine

## 2018-09-07 ENCOUNTER — Emergency Department: Admit: 2018-09-07 | Payer: Self-pay

## 2018-09-07 DIAGNOSIS — G40909 Epilepsy, unspecified, not intractable, without status epilepticus: Principal | ICD-10-CM

## 2018-09-07 LAB — CBC WITH AUTO DIFFERENTIAL
Basophils %: 1 % (ref 0.0–2.0)
Basophils Absolute: 0 10*3/uL (ref 0.0–0.2)
Eosinophils %: 2 % (ref 0.5–7.8)
Eosinophils Absolute: 0.1 10*3/uL (ref 0.0–0.8)
Granulocyte Absolute Count: 0 10*3/uL (ref 0.0–0.5)
Hematocrit: 49.2 % (ref 41.1–50.3)
Hemoglobin: 15.4 g/dL (ref 13.6–17.2)
Immature Granulocytes: 1 % (ref 0.0–5.0)
Lymphocytes %: 39 % (ref 13–44)
Lymphocytes Absolute: 2.4 10*3/uL (ref 0.5–4.6)
MCH: 33.3 PG — ABNORMAL HIGH (ref 26.1–32.9)
MCHC: 31.3 g/dL — ABNORMAL LOW (ref 31.4–35.0)
MCV: 106.5 FL — ABNORMAL HIGH (ref 79.6–97.8)
MPV: 10.2 FL (ref 9.4–12.3)
Monocytes %: 7 % (ref 4.0–12.0)
Monocytes Absolute: 0.5 10*3/uL (ref 0.1–1.3)
NRBC Absolute: 0 10*3/uL (ref 0.0–0.2)
Neutrophils %: 51 % (ref 43–78)
Neutrophils Absolute: 3.2 10*3/uL (ref 1.7–8.2)
Platelets: 171 10*3/uL (ref 150–450)
RBC: 4.62 M/uL (ref 4.23–5.6)
RDW: 12.8 % (ref 11.9–14.6)
WBC: 6.2 10*3/uL (ref 4.3–11.1)

## 2018-09-07 LAB — COMPREHENSIVE METABOLIC PANEL
ALT: 144 U/L — ABNORMAL HIGH (ref 12–65)
AST: 178 U/L — ABNORMAL HIGH (ref 15–37)
Albumin/Globulin Ratio: 0.8 — ABNORMAL LOW (ref 1.2–3.5)
Albumin: 4.2 g/dL (ref 3.5–5.0)
Alkaline Phosphatase: 181 U/L — ABNORMAL HIGH (ref 50–136)
Anion Gap: 27 mmol/L — ABNORMAL HIGH (ref 7–16)
BUN: 8 MG/DL (ref 6–23)
CO2: 7 mmol/L — CL (ref 21–32)
Calcium: 9.5 MG/DL (ref 8.3–10.4)
Chloride: 107 mmol/L (ref 98–107)
Creatinine: 1.31 MG/DL (ref 0.8–1.5)
EGFR IF NonAfrican American: >60 mL/min/{1.73_m2} (ref >60)
GFR African American: >60 mL/min/{1.73_m2} (ref >60)
Globulin: 5.1 g/dL — ABNORMAL HIGH (ref 2.3–3.5)
Glucose: 124 mg/dL — ABNORMAL HIGH (ref 65–100)
Potassium: 4.4 mmol/L (ref 3.5–5.1)
Sodium: 141 mmol/L (ref 136–145)
Total Bilirubin: 0.5 MG/DL (ref 0.2–1.1)
Total Protein: 9.3 g/dL — ABNORMAL HIGH (ref 6.3–8.2)

## 2018-09-07 LAB — DRUG SCREEN, URINE
AMPHETAMINES: NEGATIVE
Amphetamine Screen, Urine: NEGATIVE
BARBITURATES: NEGATIVE
BENZODIAZEPINES: POSITIVE
Barbiturate Screen, Urine: NEGATIVE
Benzodiazepine Screen, Urine: POSITIVE
COCAINE: NEGATIVE
Cocaine Screen Urine: NEGATIVE
METHADONE: NEGATIVE
Methadone Screen, Urine: NEGATIVE
OPIATES: NEGATIVE
Opiate Screen, Urine: NEGATIVE
PCP Screen, Urine: NEGATIVE
PCP(PHENCYCLIDINE): NEGATIVE
THC (TH-CANNABINOL): NEGATIVE
THC Screen, Urine: NEGATIVE

## 2018-09-07 LAB — BLOOD GAS, ARTERIAL POC
BASE DEFICIT (POC): 7 mmol/L
Base deficit (POC): 7 mmol/L
CO2, POC: 21 MMOL/L
COLLECT TIME: 1250
COLLECT TIME: 1250
FIO2 (POC): 21 %
FIO2: 21 %
HCO3 (POC): 19.5 MMOL/L — ABNORMAL LOW (ref 22–26)
HCO3, Art: 19.5 MMOL/L — ABNORMAL LOW (ref 22–26)
POC O2 SAT: 91 % — ABNORMAL LOW (ref 95–98)
TCO2, POC: 21 MMOL/L
pCO2 (POC): 40.4 MMHG (ref 35–45)
pCO2, Art: 40.4 MMHG (ref 35–45)
pH (POC): 7.292 — ABNORMAL LOW (ref 7.35–7.45)
pH, Art: 7.292 — ABNORMAL LOW (ref 7.35–7.45)
pO2 (POC): 68 MMHG — ABNORMAL LOW (ref 75–100)
pO2, Art: 68 MMHG — ABNORMAL LOW (ref 75–100)
sO2 (POC): 91 % — ABNORMAL LOW (ref 95–98)

## 2018-09-07 LAB — LACTIC ACID
Lactic Acid: 1.9 MMOL/L (ref 0.4–2.0)
Lactic acid: 1.9 MMOL/L (ref 0.4–2.0)

## 2018-09-07 LAB — EKG 12-LEAD
Atrial Rate: 122 {beats}/min
P Axis: 82 degrees
P-R Interval: 138 ms
Q-T Interval: 300 ms
QRS Duration: 76 ms
QTc Calculation (Bazett): 427 ms
R Axis: 81 degrees
T Axis: 64 degrees
Ventricular Rate: 122 {beats}/min

## 2018-09-07 LAB — ETHYL ALCOHOL
ALCOHOL(ETHYL),SERUM: 12 MG/DL
Ethyl Alcohol: 12 MG/DL

## 2018-09-07 LAB — MAGNESIUM
Magnesium: 2.4 mg/dL (ref 1.8–2.4)
Magnesium: 2.4 mg/dL (ref 1.8–2.4)

## 2018-09-07 LAB — CBC WITH AUTOMATED DIFF
ABS. BASOPHILS: 0 10*3/uL (ref 0.0–0.2)
ABS. EOSINOPHILS: 0.1 10*3/uL (ref 0.0–0.8)
ABS. IMM. GRANS.: 0 10*3/uL (ref 0.0–0.5)
ABS. LYMPHOCYTES: 2.4 10*3/uL (ref 0.5–4.6)
ABS. MONOCYTES: 0.5 10*3/uL (ref 0.1–1.3)
ABS. NEUTROPHILS: 3.2 10*3/uL (ref 1.7–8.2)
ABSOLUTE NRBC: 0 10*3/uL (ref 0.0–0.2)
BASOPHILS: 1 % (ref 0.0–2.0)
EOSINOPHILS: 2 % (ref 0.5–7.8)
HCT: 49.2 % (ref 41.1–50.3)
HGB: 15.4 g/dL (ref 13.6–17.2)
IMMATURE GRANULOCYTES: 1 % (ref 0.0–5.0)
LYMPHOCYTES: 39 % (ref 13–44)
MCH: 33.3 PG — ABNORMAL HIGH (ref 26.1–32.9)
MCHC: 31.3 g/dL — ABNORMAL LOW (ref 31.4–35.0)
MCV: 106.5 FL — ABNORMAL HIGH (ref 79.6–97.8)
MONOCYTES: 7 % (ref 4.0–12.0)
MPV: 10.2 FL (ref 9.4–12.3)
NEUTROPHILS: 51 % (ref 43–78)
PLATELET: 171 10*3/uL (ref 150–450)
RBC: 4.62 M/uL (ref 4.23–5.6)
RDW: 12.8 % (ref 11.9–14.6)
WBC: 6.2 10*3/uL (ref 4.3–11.1)

## 2018-09-07 LAB — METABOLIC PANEL, COMPREHENSIVE
A-G Ratio: 0.8 — ABNORMAL LOW (ref 1.2–3.5)
ALT (SGPT): 144 U/L — ABNORMAL HIGH (ref 12–65)
AST (SGOT): 178 U/L — ABNORMAL HIGH (ref 15–37)
Albumin: 4.2 g/dL (ref 3.5–5.0)
Alk. phosphatase: 181 U/L — ABNORMAL HIGH (ref 50–136)
Anion gap: 27 mmol/L — ABNORMAL HIGH (ref 7–16)
BUN: 8 MG/DL (ref 6–23)
Bilirubin, total: 0.5 MG/DL (ref 0.2–1.1)
CO2: 7 mmol/L — CL (ref 21–32)
Calcium: 9.5 MG/DL (ref 8.3–10.4)
Chloride: 107 mmol/L (ref 98–107)
Creatinine: 1.31 MG/DL (ref 0.8–1.5)
GFR est AA: >60 mL/min/{1.73_m2} (ref >60)
GFR est non-AA: >60 mL/min/{1.73_m2} (ref >60)
Globulin: 5.1 g/dL — ABNORMAL HIGH (ref 2.3–3.5)
Glucose: 124 mg/dL — ABNORMAL HIGH (ref 65–100)
Potassium: 4.4 mmol/L (ref 3.5–5.1)
Protein, total: 9.3 g/dL — ABNORMAL HIGH (ref 6.3–8.2)
Sodium: 141 mmol/L (ref 136–145)

## 2018-09-07 LAB — EKG, 12 LEAD, INITIAL
Atrial Rate: 122 {beats}/min
Calculated P Axis: 82 degrees
Calculated R Axis: 81 degrees
Calculated T Axis: 64 degrees
P-R Interval: 138 ms
Q-T Interval: 300 ms
QRS Duration: 76 ms
QTC Calculation (Bezet): 427 ms
Ventricular Rate: 122 {beats}/min

## 2018-09-07 MED ORDER — SODIUM CHLORIDE 0.9 % IJ SYRG
INTRAMUSCULAR | Status: DC | PRN
Start: 2018-09-07 — End: 2018-09-09

## 2018-09-07 MED ORDER — LEVETIRACETAM 500 MG/5 ML IV SOLN
500 mg/5 mL | Freq: Once | INTRAVENOUS | Status: AC
Start: 2018-09-07 — End: 2018-09-07
  Administered 2018-09-07: 17:00:00 via INTRAVENOUS

## 2018-09-07 MED ORDER — LORAZEPAM 2 MG/ML IJ SOLN
2 mg/mL | INTRAMUSCULAR | Status: AC
Start: 2018-09-07 — End: 2018-09-07
  Administered 2018-09-07: 18:00:00 via INTRAVENOUS

## 2018-09-07 MED ORDER — HEPARIN (PORCINE) 5,000 UNIT/ML IJ SOLN
5000 unit/mL | Freq: Three times a day (TID) | INTRAMUSCULAR | Status: DC
Start: 2018-09-07 — End: 2018-09-09
  Administered 2018-09-08 – 2018-09-09 (×6): via SUBCUTANEOUS

## 2018-09-07 MED ORDER — ONDANSETRON 4 MG TAB, RAPID DISSOLVE
4 mg | ORAL | Status: DC | PRN
Start: 2018-09-07 — End: 2018-09-09

## 2018-09-07 MED ORDER — SODIUM CHLORIDE 0.9 % IV
5 mg/mL | Freq: Once | INTRAVENOUS | Status: AC
Start: 2018-09-07 — End: 2018-09-07
  Administered 2018-09-07: 18:00:00 via INTRAVENOUS

## 2018-09-07 MED ORDER — LORAZEPAM 2 MG/ML IJ SOLN
2 mg/mL | INTRAMUSCULAR | Status: DC | PRN
Start: 2018-09-07 — End: 2018-09-09
  Administered 2018-09-07 – 2018-09-08 (×2): via INTRAVENOUS

## 2018-09-07 MED ORDER — SODIUM CHLORIDE 0.9% BOLUS IV
0.9 % | Freq: Once | INTRAVENOUS | Status: AC
Start: 2018-09-07 — End: 2018-09-07
  Administered 2018-09-07: 18:00:00 via INTRAVENOUS

## 2018-09-07 MED ORDER — LORAZEPAM 2 MG/ML IJ SOLN
2 mg/mL | INTRAMUSCULAR | Status: DC
Start: 2018-09-07 — End: 2018-09-07
  Administered 2018-09-07: 17:00:00 via INTRAVENOUS

## 2018-09-07 MED ORDER — FOLIC ACID 5 MG/ML IJ SOLN
5 mg/mL | Freq: Once | INTRAMUSCULAR | Status: DC
Start: 2018-09-07 — End: 2018-09-07
  Administered 2018-09-07: 18:00:00 via INTRAVENOUS

## 2018-09-07 MED ORDER — ACETAMINOPHEN 325 MG TABLET
325 mg | ORAL | Status: DC | PRN
Start: 2018-09-07 — End: 2018-09-09
  Administered 2018-09-07 – 2018-09-08 (×4): via ORAL

## 2018-09-07 MED ORDER — SODIUM CHLORIDE 0.9 % IV
5005 mg/5 mL | Freq: Two times a day (BID) | INTRAVENOUS | Status: DC
Start: 2018-09-07 — End: 2018-09-09
  Administered 2018-09-08 – 2018-09-09 (×4): via INTRAVENOUS

## 2018-09-07 MED ORDER — THIAMINE 100 MG/ML INJECTION
100 mg/mL | INTRAMUSCULAR | Status: DC
Start: 2018-09-07 — End: 2018-09-07
  Administered 2018-09-07: 18:00:00 via INTRAVENOUS

## 2018-09-07 MED ORDER — THIAMINE 100 MG/ML INJECTION
100 mg/mL | Freq: Once | INTRAMUSCULAR | Status: AC
Start: 2018-09-07 — End: 2018-09-07
  Administered 2018-09-07: 22:00:00 via INTRAVENOUS

## 2018-09-07 MED ORDER — BISACODYL 5 MG TAB, DELAYED RELEASE
5 mg | Freq: Every day | ORAL | Status: DC | PRN
Start: 2018-09-07 — End: 2018-09-09

## 2018-09-07 MED ORDER — HYDROCODONE-ACETAMINOPHEN 5 MG-325 MG TAB
5-325 mg | ORAL | Status: DC | PRN
Start: 2018-09-07 — End: 2018-09-09

## 2018-09-07 MED ORDER — SODIUM CHLORIDE 0.9 % IJ SYRG
Freq: Three times a day (TID) | INTRAMUSCULAR | Status: DC
Start: 2018-09-07 — End: 2018-09-09
  Administered 2018-09-07 – 2018-09-09 (×7): via INTRAVENOUS

## 2018-09-07 MED FILL — SODIUM CHLORIDE 0.9 % IV: INTRAVENOUS | Qty: 1000

## 2018-09-07 MED FILL — LORAZEPAM 2 MG/ML IJ SOLN: 2 mg/mL | INTRAMUSCULAR | Qty: 1

## 2018-09-07 MED FILL — LEVETIRACETAM 500 MG/5 ML IV SOLN: 500 mg/5 mL | INTRAVENOUS | Qty: 10

## 2018-09-07 MED FILL — TYLENOL 325 MG TABLET: 325 mg | ORAL | Qty: 2

## 2018-09-07 MED FILL — THIAMINE 100 MG/ML INJECTION: 100 mg/mL | INTRAMUSCULAR | Qty: 2

## 2018-09-07 MED FILL — FOLIC ACID 5 MG/ML IJ SOLN: 5 mg/mL | INTRAMUSCULAR | Qty: 0.2

## 2018-09-07 NOTE — ED Notes (Signed)
Pt defecated on self. Pt cleaned and gown and brief placed on pt.

## 2018-09-07 NOTE — ED Notes (Signed)
TRANSFER - OUT REPORT:    Verbal report given to AL, RN on Barry Gregory  being transferred to Medical for routine progression of care       Report consisted of patient???s Situation, Background, Assessment and   Recommendations(SBAR).     Information from the following report(s) ED Summary was reviewed with the receiving nurse.    Lines:   Peripheral IV 09/07/18 Right Hand (Active)       Peripheral IV 09/07/18 Right Wrist (Active)        Opportunity for questions and clarification was provided.      Patient transported with:  Belongings.

## 2018-09-07 NOTE — Progress Notes (Signed)
09/07/18 1516   Dual Skin Pressure Injury Assessment   Dual Skin Pressure Injury Assessment WDL   Primary Nurse Rogene Houston, RN and Sarah RN, RN performed a dual skin assessment on this patient No impairment noted  Braden score is 19

## 2018-09-07 NOTE — ED Provider Notes (Signed)
Patient has a reported history of alcoholism and seizure disorder for which he takes Keppra.  He is reportedly noncompliant with his Keppra.  EMS was called by the patient's sister due to seizure.  Patient had a witnessed 30 second seizure and was postictal afterwards.  Per EMS, he had another seizure in route.  He was given Versed 2.5 mg IV by EMS.  Patient is unable to provide a meaningful history.           No past medical history on file.    No past surgical history on file.      No family history on file.    Social History     Socioeconomic History   ??? Marital status: MARRIED     Spouse name: Not on file   ??? Number of children: Not on file   ??? Years of education: Not on file   ??? Highest education level: Not on file   Occupational History   ??? Not on file   Social Needs   ??? Financial resource strain: Not on file   ??? Food insecurity:     Worry: Not on file     Inability: Not on file   ??? Transportation needs:     Medical: Not on file     Non-medical: Not on file   Tobacco Use   ??? Smoking status: Current Every Day Smoker     Packs/day: 1.00     Years: 20.00     Pack years: 20.00   Substance and Sexual Activity   ??? Alcohol use: Yes     Comment: social- a lot of Vodka tonight   ??? Drug use: No   ??? Sexual activity: Yes     Partners: Female   Lifestyle   ??? Physical activity:     Days per week: Not on file     Minutes per session: Not on file   ??? Stress: Not on file   Relationships   ??? Social connections:     Talks on phone: Not on file     Gets together: Not on file     Attends religious service: Not on file     Active member of club or organization: Not on file     Attends meetings of clubs or organizations: Not on file     Relationship status: Not on file   ??? Intimate partner violence:     Fear of current or ex partner: Not on file     Emotionally abused: Not on file     Physically abused: Not on file     Forced sexual activity: Not on file   Other Topics Concern   ??? Not on file   Social History Narrative   ??? Not on  file         ALLERGIES: Patient has no known allergies.    Review of Systems   Unable to perform ROS: Mental status change       Vitals:    09/07/18 1105 09/07/18 1110   BP:  125/77   Pulse: (!) 112    Resp: 12    SpO2: 95%             Physical Exam  Vitals signs and nursing note reviewed.   Constitutional:       Appearance: He is well-developed.      Comments: Patient has sonorous respirations, is not arousable   HENT:      Head: Normocephalic and atraumatic.  Cardiovascular:      Rate and Rhythm: Normal rate and regular rhythm.   Pulmonary:      Effort: Pulmonary effort is normal. No respiratory distress.   Abdominal:      General: There is no distension.      Palpations: Abdomen is soft.   Musculoskeletal:      Right lower leg: No edema.      Left lower leg: No edema.   Skin:     General: Skin is warm and dry.          MDM  Number of Diagnoses or Management Options  Alcoholism (HCC):   DTs (delirium tremens) (HCC): new and requires workup  Metabolic acidosis: new and requires workup  Diagnosis management comments: 12:13 PM bicarb is 7, will be given IV fluids. ABG pending.  1:20 PM ABG shows pH 7.29.  The hospitalist has been paged.  IV fluids are going.       Amount and/or Complexity of Data Reviewed  Clinical lab tests: ordered and reviewed  Tests in the radiology section of CPT??: ordered and reviewed  Discuss the patient with other providers: yes    Risk of Complications, Morbidity, and/or Mortality  Presenting problems: high  Diagnostic procedures: moderate  Management options: moderate    Patient Progress  Patient progress: improved         Procedures

## 2018-09-07 NOTE — ED Notes (Signed)
Pt defecated on self. Pt cleaned and gown and brief placed on pt.

## 2018-09-07 NOTE — ED Notes (Signed)
Pt from arrives via EMS from home. Pt has hx of seizures and ETOH abuse. Pt sister on scene states pt does not get meds filled. Pt was post ictal when pt sister found him and sister witnessed seizure about 30 seconds long and another in route. Pt appears post itctal at this time with snoring. Pt has bloody saliva running down cheek. 2.5mg  versed given via 20g R hand in route. Possible urination and defecation. Pt has possible rash on right check and possible bruising on left cheek. Pt is not responsive to painful stimuli. Pt possibly fell off of couch as was found in the floor by the couch.

## 2018-09-07 NOTE — H&P (Signed)
H&P by [name], MD at 09/07/18  1344                Author: [name], MD  Service: Internal Medicine  Author Type: Physician       Filed: 09/07/18 1452  Date of Service: 09/07/18 1344  Status: Signed          Editor: [name], MD (Physician)                                 HOSPITALIST H&P/CONSULT   NAME:  Barry Gregory     Age:  48 y.o.   DOB:   September 12, 1970    MRN:   [id number]   PCP: None   Consulting MD:   Treatment Team: Attending Provider: Cicero Duck, MD; Primary Nurse: Cox, April M, RN     HPI:     Patient is a 48yo M with hx alcohol abuse, seizures who presents to ER after multiple seizures at home.  Patient postictal and unable to contribute meaningfully  to history.  Accompanied by sister who provides history.    Found by sister on floor this morning confused, possibly postictal.  Started to feel better then had generalized seizure while face-down on the floor. Called EMS.  Per EMS, had another seizure in route, received versed.     The patient is an active abuser of alcohol, 12 or more beers daily and an unknown amount of liquor.  Since last hospitalization, has been making attempts to stop but has been having intermittent heavy drinking.  She does report shakes when he stops drinking.   She has no idea how much he has been drinking in the past few days.      There is also a strong family history of epilepsy in father and siblings. The patient was started on keppra in ER here but not taking it and does not seem to have had formal neuro eval for epilepsy.       Patient wakes up, asks where he is, denies remembering anything this morning, and falls back asleep. Not tremulous, denies shaking or anxiety.      ROS limited to above due to mental state   No past medical history on file.    No past surgical history on file. reviewed     Prior to Admission Medications     Prescriptions  Last Dose  Informant  Patient Reported?  Taking?      levETIRAcetam (KEPPRA) 500 mg tablet      No   No      Sig: Take 1 Tab by mouth two (2) times a day.               Facility-Administered Medications: None        No Known Allergies      Social History          Tobacco Use         ?  Smoking status:  Current Every Day Smoker              Packs/day:  1.00         Years:  20.00         Pack years:  20.00       Substance Use Topics         ?  Alcohol use:  Yes  Comment: social- a lot of Vodka tonight         No family history on file. as above     Objective:        Visit Vitals      BP  (!) 149/92     Pulse  90     Resp  12        SpO2  99%         No data recorded.      Oxygen Therapy   O2 Sat (%): 99 % (09/07/18 1325)   Pulse via Oximetry: 91 beats per minute (09/07/18 1325)   Physical Exam:   General:    Confused, somnolent   Head:   Bruising to face, no deformity    Nose:  Nares normal. No drainage or sinus tenderness.   Lungs:   Clear to auscultation bilaterally.  No Wheezing or Rhonchi. No rales.   Heart:   Tachy   Abdomen:   Soft, non-tender. Not distended.  Bowel sounds normal.    Extremities: No cyanosis.  No edema. No clubbing   Skin:     Bruising to face, dry erythematous, raised rash on neck (chronic)   Neurologic: oriented to person. No tremor      Data Review:      Recent Results (from the past 24 hour(s))     CBC WITH AUTOMATED DIFF          Collection Time: 09/07/18 11:15 AM         Result  Value  Ref Range            WBC  6.2  4.3 - 11.1 K/uL       RBC  4.62  4.23 - 5.6 M/uL       HGB  15.4  13.6 - 17.2 g/dL       HCT  49.2  41.1 - 50.3 %       MCV  106.5 (H)  79.6 - 97.8 FL       MCH  33.3 (H)  26.1 - 32.9 PG       MCHC  31.3 (L)  31.4 - 35.0 g/dL       RDW  12.8  11.9 - 14.6 %       PLATELET  171  150 - 450 K/uL       MPV  10.2  9.4 - 12.3 FL       ABSOLUTE NRBC  0.00  0.0 - 0.2 K/uL       DF  AUTOMATED          NEUTROPHILS  51  43 - 78 %       LYMPHOCYTES  39  13 - 44 %       MONOCYTES  7  4.0 - 12.0 %       EOSINOPHILS  2  0.5 - 7.8 %       BASOPHILS  1  0.0 - 2.0 %       IMMATURE  GRANULOCYTES  1  0.0 - 5.0 %       ABS. NEUTROPHILS  3.2  1.7 - 8.2 K/UL       ABS. LYMPHOCYTES  2.4  0.5 - 4.6 K/UL       ABS. MONOCYTES  0.5  0.1 - 1.3 K/UL       ABS. EOSINOPHILS  0.1  0.0 - 0.8 K/UL       ABS. BASOPHILS  0.0  0.0 -  0.2 K/UL       ABS. IMM. GRANS.  0.0  0.0 - 0.5 K/UL       METABOLIC PANEL, COMPREHENSIVE          Collection Time: 09/07/18 11:15 AM         Result  Value  Ref Range            Sodium  141  136 - 145 mmol/L       Potassium  4.4  3.5 - 5.1 mmol/L       Chloride  107  98 - 107 mmol/L       CO2  7 (LL)  21 - 32 mmol/L       Anion gap  27 (H)  7 - 16 mmol/L       Glucose  124 (H)  65 - 100 mg/dL       BUN  8  6 - 23 MG/DL       Creatinine  1.31  0.8 - 1.5 MG/DL       GFR est AA  >60  >60 ml/min/1.57m       GFR est non-AA  >60  >60 ml/min/1.784m      Calcium  9.5  8.3 - 10.4 MG/DL       Bilirubin, total  0.5  0.2 - 1.1 MG/DL       ALT (SGPT)  144 (H)  12 - 65 U/L       AST (SGOT)  178 (H)  15 - 37 U/L       Alk. phosphatase  181 (H)  50 - 136 U/L       Protein, total  9.3 (H)  6.3 - 8.2 g/dL       Albumin  4.2  3.5 - 5.0 g/dL       Globulin  5.1 (H)  2.3 - 3.5 g/dL       A-G Ratio  0.8 (L)  1.2 - 3.5         ETHYL ALCOHOL          Collection Time: 09/07/18 11:15 AM         Result  Value  Ref Range            ALCOHOL(ETHYL),SERUM  12  MG/DL       MAGNESIUM          Collection Time: 09/07/18 11:15 AM         Result  Value  Ref Range            Magnesium  2.4  1.8 - 2.4 mg/dL       EKG, 12 LEAD, INITIAL          Collection Time: 09/07/18 11:23 AM         Result  Value  Ref Range            Ventricular Rate  122  BPM       Atrial Rate  122  BPM       P-R Interval  138  ms       QRS Duration  76  ms       Q-T Interval  300  ms       QTC Calculation (Bezet)  427  ms       Calculated P Axis  82  degrees       Calculated R Axis  81  degrees       Calculated T Axis  64  degrees       Diagnosis                 !! AGE AND GENDER SPECIFIC ECG ANALYSIS !!   Sinus tachycardia with Premature  supraventricular complexes   Biatrial enlargement   Anterior infarct (cited on or before 06-Sep-2018)   Abnormal ECG   When compared with ECG of 07-Sep-2018 11:17,   Premature supraventricular complexes are now Present          DRUG SCREEN, URINE          Collection Time: 09/07/18 12:36 PM         Result  Value  Ref Range            PCP(PHENCYCLIDINE)  NEGATIVE           BENZODIAZEPINES  POSITIVE          COCAINE  NEGATIVE           AMPHETAMINES  NEGATIVE           METHADONE  NEGATIVE           THC (TH-CANNABINOL)  NEGATIVE           OPIATES  NEGATIVE           BARBITURATES  NEGATIVE           POC G3          Collection Time: 09/07/18 12:54 PM         Result  Value  Ref Range            Device:  ROOM AIR          FIO2 (POC)  21  %       pH (POC)  7.292 (L)  7.35 - 7.45         pCO2 (POC)  40.4  35 - 45 MMHG       pO2 (POC)  68 (L)  75 - 100 MMHG       HCO3 (POC)  19.5 (L)  22 - 26 MMOL/L       sO2 (POC)  91 (L)  95 - 98 %       Base deficit (POC)  7  mmol/L       Allens test (POC)  YES          Site  RIGHT RADIAL          Specimen type (POC)  ARTERIAL          Performed by  [email or web address]         CO2, POC  21  MMOL/L       Respiratory comment:  PhysicianNotified              COLLECT TIME  1,250           Imaging /Procedures /Studies      CT HEAD WO CONT       Final Result     Impression: Stable CT head without contrast. No acute abnormality.                              Assessment and Plan:          Active Hospital Problems           Diagnosis  Date Noted         ?  Seizure ([place])  09/07/2018     ?  Alcohol abuse  09/07/2018         ?  Metabolic acidosis  56/81/2751           PLAN:   Alcohol level 12, unclear recent recent pattern of use.  Slightly tachy and hypertensive, but no clear physical signs of withdrawal after already having benzos.   Loaded with keppra, continue. Primary seizure disorder diagnosis questionable.    Ativan prn, consider librium when alert enough for PO if having withdrawal sx   Banana bag       Metabolic acidosis, 2/2 above. Fluids. Monitor. Check lactic.       DVT PPx: hsq   Code Status: full      Anticipated discharge: 2-3d         Signed By:  [name], MD        September 07, 2018

## 2018-09-07 NOTE — ED Notes (Signed)
 TRANSFER - OUT REPORT:    Verbal report given to AL, RN on Barry Gregory  being transferred to Medical for routine progression of care       Report consisted of patient's Situation, Background, Assessment and   Recommendations(SBAR).     Information from the following report(s) ED Summary was reviewed with the receiving nurse.    Lines:   Peripheral IV 09/07/18 Right Hand (Active)       Peripheral IV 09/07/18 Right Wrist (Active)        Opportunity for questions and clarification was provided.      Patient transported with:  Belongings.

## 2018-09-07 NOTE — ED Provider Notes (Signed)
Patient has a reported history of alcoholism and seizure disorder for which he takes Keppra.  He is reportedly noncompliant with his Keppra.  EMS was called by the patient's sister due to seizure.  Patient had a witnessed 30 second seizure and was postictal afterwards.  Per EMS, he had another seizure in route.  He was given Versed 2.5 mg IV by EMS.  Patient is unable to provide a meaningful history.           No past medical history on file.    No past surgical history on file.      No family history on file.    Social History     Socioeconomic History   ??? Marital status: MARRIED     Spouse name: Not on file   ??? Number of children: Not on file   ??? Years of education: Not on file   ??? Highest education level: Not on file   Occupational History   ??? Not on file   Social Needs   ??? Financial resource strain: Not on file   ??? Food insecurity:     Worry: Not on file     Inability: Not on file   ??? Transportation needs:     Medical: Not on file     Non-medical: Not on file   Tobacco Use   ??? Smoking status: Current Every Day Smoker     Packs/day: 1.00     Years: 20.00     Pack years: 20.00   Substance and Sexual Activity   ??? Alcohol use: Yes     Comment: social- a lot of Vodka tonight   ??? Drug use: No   ??? Sexual activity: Yes     Partners: Female   Lifestyle   ??? Physical activity:     Days per week: Not on file     Minutes per session: Not on file   ??? Stress: Not on file   Relationships   ??? Social connections:     Talks on phone: Not on file     Gets together: Not on file     Attends religious service: Not on file     Active member of club or organization: Not on file     Attends meetings of clubs or organizations: Not on file     Relationship status: Not on file   ??? Intimate partner violence:     Fear of current or ex partner: Not on file     Emotionally abused: Not on file     Physically abused: Not on file     Forced sexual activity: Not on file   Other Topics Concern   ??? Not on file   Social History Narrative    ??? Not on file         ALLERGIES: Patient has no known allergies.    Review of Systems   Unable to perform ROS: Mental status change       Vitals:    09/07/18 1105 09/07/18 1110   BP:  125/77   Pulse: (!) 112    Resp: 12    SpO2: 95%             Physical Exam  Vitals signs and nursing note reviewed.   Constitutional:       Appearance: He is well-developed.      Comments: Patient has sonorous respirations, is not arousable   HENT:      Head: Normocephalic and atraumatic.  Cardiovascular:      Rate and Rhythm: Normal rate and regular rhythm.   Pulmonary:      Effort: Pulmonary effort is normal. No respiratory distress.   Abdominal:      General: There is no distension.      Palpations: Abdomen is soft.   Musculoskeletal:      Right lower leg: No edema.      Left lower leg: No edema.   Skin:     General: Skin is warm and dry.          MDM  Number of Diagnoses or Management Options  Alcoholism (HCC):   DTs (delirium tremens) (HCC): new and requires workup  Metabolic acidosis: new and requires workup  Diagnosis management comments: 12:13 PM bicarb is 7, will be given IV fluids. ABG pending.  1:20 PM ABG shows pH 7.29.  The hospitalist has been paged.  IV fluids are going.       Amount and/or Complexity of Data Reviewed  Clinical lab tests: ordered and reviewed  Tests in the radiology section of CPT??: ordered and reviewed  Discuss the patient with other providers: yes    Risk of Complications, Morbidity, and/or Mortality  Presenting problems: high  Diagnostic procedures: moderate  Management options: moderate    Patient Progress  Patient progress: improved         Procedures

## 2018-09-07 NOTE — ED Triage Notes (Addendum)
Pt from arrives via EMS from home. Pt has hx of seizures and ETOH abuse. Pt sister on scene states pt does not get meds filled. Pt was post ictal when pt sister found him and sister witnessed seizure about 30 seconds long and another in route. Pt appears post itctal at this time with snoring. Pt has bloody saliva running down cheek. 2.5mg versed given via 20g R hand in route. Possible urination and defecation. Pt has possible rash on right check and possible bruising on left cheek. Pt is not responsive to painful stimuli. Pt possibly fell off of couch as was found in the floor by the couch.

## 2018-09-07 NOTE — H&P (Signed)
HOSPITALIST H&P/CONSULT  NAME:  Barry Gregory   Age:  48 y.o.  DOB:   1970-08-10   MRN:   [id number]  PCP: None  Consulting MD:  Treatment Team: Attending Provider: Cicero Duck, MD; Primary Nurse: Cox, April M, RN  HPI:   Patient is a 48yo M with hx alcohol abuse, seizures who presents to ER after multiple seizures at home.  Patient postictal and unable to contribute meaningfully to history.  Accompanied by sister who provides history.   Found by sister on floor this morning confused, possibly postictal.  Started to feel better then had generalized seizure while face-down on the floor. Called EMS.  Per EMS, had another seizure in route, received versed.    The patient is an active abuser of alcohol, 12 or more beers daily and an unknown amount of liquor.  Since last hospitalization, has been making attempts to stop but has been having intermittent heavy drinking.  She does report shakes when he stops drinking.  She has no idea how much he has been drinking in the past few days.    There is also a strong family history of epilepsy in father and siblings. The patient was started on keppra in ER here but not taking it and does not seem to have had formal neuro eval for epilepsy.     Patient wakes up, asks where he is, denies remembering anything this morning, and falls back asleep. Not tremulous, denies shaking or anxiety.    ROS limited to above due to mental state  No past medical history on file.   No past surgical history on file. reviewed  Prior to Admission Medications   Prescriptions Last Dose Informant Patient Reported? Taking?   levETIRAcetam (KEPPRA) 500 mg tablet   No No   Sig: Take 1 Tab by mouth two (2) times a day.      Facility-Administered Medications: None     No Known Allergies   Social History     Tobacco Use   ??? Smoking status: Current Every Day Smoker     Packs/day: 1.00     Years: 20.00     Pack years: 20.00   Substance Use Topics   ??? Alcohol use: Yes      Comment: social- a lot of Vodka tonight      No family history on file. as above  Objective:     Visit Vitals  BP (!) 149/92   Pulse 90   Resp 12   SpO2 99%      No data recorded.    Oxygen Therapy  O2 Sat (%): 99 % (09/07/18 1325)  Pulse via Oximetry: 91 beats per minute (09/07/18 1325)  Physical Exam:  General:    Confused, somnolent  Head:   Bruising to face, no deformity   Nose:  Nares normal. No drainage or sinus tenderness.  Lungs:   Clear to auscultation bilaterally.  No Wheezing or Rhonchi. No rales.  Heart:   Tachy  Abdomen:   Soft, non-tender. Not distended.  Bowel sounds normal.   Extremities: No cyanosis.  No edema. No clubbing  Skin:     Bruising to face, dry erythematous, raised rash on neck (chronic)  Neurologic: oriented to person. No tremor    Data Review:   Recent Results (from the past 24 hour(s))   CBC WITH AUTOMATED DIFF    Collection Time: 09/07/18 11:15 AM   Result Value Ref Range    WBC 6.2 4.3 - 11.1  K/uL    RBC 4.62 4.23 - 5.6 M/uL    HGB 15.4 13.6 - 17.2 g/dL    HCT 49.2 41.1 - 50.3 %    MCV 106.5 (H) 79.6 - 97.8 FL    MCH 33.3 (H) 26.1 - 32.9 PG    MCHC 31.3 (L) 31.4 - 35.0 g/dL    RDW 12.8 11.9 - 14.6 %    PLATELET 171 150 - 450 K/uL    MPV 10.2 9.4 - 12.3 FL    ABSOLUTE NRBC 0.00 0.0 - 0.2 K/uL    DF AUTOMATED      NEUTROPHILS 51 43 - 78 %    LYMPHOCYTES 39 13 - 44 %    MONOCYTES 7 4.0 - 12.0 %    EOSINOPHILS 2 0.5 - 7.8 %    BASOPHILS 1 0.0 - 2.0 %    IMMATURE GRANULOCYTES 1 0.0 - 5.0 %    ABS. NEUTROPHILS 3.2 1.7 - 8.2 K/UL    ABS. LYMPHOCYTES 2.4 0.5 - 4.6 K/UL    ABS. MONOCYTES 0.5 0.1 - 1.3 K/UL    ABS. EOSINOPHILS 0.1 0.0 - 0.8 K/UL    ABS. BASOPHILS 0.0 0.0 - 0.2 K/UL    ABS. IMM. GRANS. 0.0 0.0 - 0.5 K/UL   METABOLIC PANEL, COMPREHENSIVE    Collection Time: 09/07/18 11:15 AM   Result Value Ref Range    Sodium 141 136 - 145 mmol/L    Potassium 4.4 3.5 - 5.1 mmol/L    Chloride 107 98 - 107 mmol/L    CO2 7 (LL) 21 - 32 mmol/L    Anion gap 27 (H) 7 - 16 mmol/L     Glucose 124 (H) 65 - 100 mg/dL    BUN 8 6 - 23 MG/DL    Creatinine 1.31 0.8 - 1.5 MG/DL    GFR est AA >60 >60 ml/min/1.5m    GFR est non-AA >60 >60 ml/min/1.729m   Calcium 9.5 8.3 - 10.4 MG/DL    Bilirubin, total 0.5 0.2 - 1.1 MG/DL    ALT (SGPT) 144 (H) 12 - 65 U/L    AST (SGOT) 178 (H) 15 - 37 U/L    Alk. phosphatase 181 (H) 50 - 136 U/L    Protein, total 9.3 (H) 6.3 - 8.2 g/dL    Albumin 4.2 3.5 - 5.0 g/dL    Globulin 5.1 (H) 2.3 - 3.5 g/dL    A-G Ratio 0.8 (L) 1.2 - 3.5     ETHYL ALCOHOL    Collection Time: 09/07/18 11:15 AM   Result Value Ref Range    ALCOHOL(ETHYL),SERUM 12 MG/DL   MAGNESIUM    Collection Time: 09/07/18 11:15 AM   Result Value Ref Range    Magnesium 2.4 1.8 - 2.4 mg/dL   EKG, 12 LEAD, INITIAL    Collection Time: 09/07/18 11:23 AM   Result Value Ref Range    Ventricular Rate 122 BPM    Atrial Rate 122 BPM    P-R Interval 138 ms    QRS Duration 76 ms    Q-T Interval 300 ms    QTC Calculation (Bezet) 427 ms    Calculated P Axis 82 degrees    Calculated R Axis 81 degrees    Calculated T Axis 64 degrees    Diagnosis       !! AGE AND GENDER SPECIFIC ECG ANALYSIS !!  Sinus tachycardia with Premature supraventricular complexes  Biatrial enlargement  Anterior infarct (cited on or before 06-Sep-2018)  Abnormal ECG  When compared with ECG of 07-Sep-2018 11:17,  Premature supraventricular complexes are now Present     DRUG SCREEN, URINE    Collection Time: 09/07/18 12:36 PM   Result Value Ref Range    PCP(PHENCYCLIDINE) NEGATIVE       BENZODIAZEPINES POSITIVE      COCAINE NEGATIVE       AMPHETAMINES NEGATIVE       METHADONE NEGATIVE       THC (TH-CANNABINOL) NEGATIVE       OPIATES NEGATIVE       BARBITURATES NEGATIVE      POC G3    Collection Time: 09/07/18 12:54 PM   Result Value Ref Range    Device: ROOM AIR      FIO2 (POC) 21 %    pH (POC) 7.292 (L) 7.35 - 7.45      pCO2 (POC) 40.4 35 - 45 MMHG    pO2 (POC) 68 (L) 75 - 100 MMHG    HCO3 (POC) 19.5 (L) 22 - 26 MMOL/L    sO2 (POC) 91 (L) 95 - 98 %     Base deficit (POC) 7 mmol/L    Allens test (POC) YES      Site RIGHT RADIAL      Specimen type (POC) ARTERIAL      Performed by HoshkoDavisRT     CO2, POC 21 MMOL/L    Respiratory comment: PhysicianNotified     COLLECT TIME 1,250       Imaging /Procedures /Studies   CT HEAD WO CONT   Final Result   Impression: Stable CT head without contrast. No acute abnormality.                Assessment and Plan:     Active Hospital Problems    Diagnosis Date Noted   ??? Seizure ([place]) 09/07/2018   ??? Alcohol abuse 09/81/1914   ??? Metabolic acidosis 78/29/5621       PLAN:  Alcohol level 12, unclear recent recent pattern of use.  Slightly tachy and hypertensive, but no clear physical signs of withdrawal after already having benzos.  Loaded with keppra, continue. Primary seizure disorder diagnosis questionable.   Ativan prn, consider librium when alert enough for PO if having withdrawal sx  Banana bag    Metabolic acidosis, 2/2 above. Fluids. Monitor. Check lactic.     DVT PPx: hsq  Code Status: full    Anticipated discharge: 2-3d    Signed By: [name], MD     September 07, 2018

## 2018-09-07 NOTE — Progress Notes (Signed)
09/07/18 1516   Dual Skin Pressure Injury Assessment   Dual Skin Pressure Injury Assessment WDL   Primary Nurse Alvaro B Arias, RN and Sarah RN, RN performed a dual skin assessment on this patient No impairment noted  Braden score is 19

## 2018-09-08 LAB — BASIC METABOLIC PANEL
Anion Gap: 6 mmol/L — ABNORMAL LOW (ref 7–16)
BUN: 5 MG/DL — ABNORMAL LOW (ref 6–23)
CO2: 25 mmol/L (ref 21–32)
Calcium: 9.1 MG/DL (ref 8.3–10.4)
Chloride: 108 mmol/L — ABNORMAL HIGH (ref 98–107)
Creatinine: 0.81 MG/DL (ref 0.8–1.5)
EGFR IF NonAfrican American: >60 mL/min/{1.73_m2} (ref >60)
GFR African American: >60 mL/min/{1.73_m2} (ref >60)
Glucose: 82 mg/dL (ref 65–100)
Potassium: 3.5 mmol/L (ref 3.5–5.1)
Sodium: 139 mmol/L (ref 136–145)

## 2018-09-08 LAB — METABOLIC PANEL, BASIC
Anion gap: 6 mmol/L — ABNORMAL LOW (ref 7–16)
BUN: 5 MG/DL — ABNORMAL LOW (ref 6–23)
CO2: 25 mmol/L (ref 21–32)
Calcium: 9.1 MG/DL (ref 8.3–10.4)
Chloride: 108 mmol/L — ABNORMAL HIGH (ref 98–107)
Creatinine: 0.81 MG/DL (ref 0.8–1.5)
GFR est AA: >60 mL/min/{1.73_m2} (ref >60)
GFR est non-AA: >60 mL/min/{1.73_m2} (ref >60)
Glucose: 82 mg/dL (ref 65–100)
Potassium: 3.5 mmol/L (ref 3.5–5.1)
Sodium: 139 mmol/L (ref 136–145)

## 2018-09-08 MED FILL — HEPARIN (PORCINE) 5,000 UNIT/ML IJ SOLN: 5000 unit/mL | INTRAMUSCULAR | Qty: 1

## 2018-09-08 MED FILL — TYLENOL 325 MG TABLET: 325 mg | ORAL | Qty: 2

## 2018-09-08 MED FILL — LORAZEPAM 2 MG/ML IJ SOLN: 2 mg/mL | INTRAMUSCULAR | Qty: 1

## 2018-09-08 MED FILL — LEVETIRACETAM 500 MG/5 ML IV SOLN: 500 mg/5 mL | INTRAVENOUS | Qty: 5

## 2018-09-08 MED FILL — LEVETIRACETAM 500 MG/5 ML IV SOLN: 500 mg/5 mL | INTRAVENOUS | Qty: 10

## 2018-09-08 NOTE — Progress Notes (Signed)
Problem: Patient Education: Go to Patient Education Activity  Goal: Patient/Family Education  Outcome: Progressing Towards Goal

## 2018-09-08 NOTE — Progress Notes (Signed)
Progress Notes by Tera Helper, MD at 09/08/18 1035                Author: Tera Helper, MD  Service: Internal Medicine  Author Type: Physician       Filed: 09/08/18 1040  Date of Service: 09/08/18 1035  Status: Signed          Editor: Tera Helper, MD (Physician)                             Hospitalist Progress Note        Admit Date:  09/07/2018 11:02 AM    Name:  Barry Gregory    Age:  48 y.o.   DOB:  [birth year]    MRN:  [id number]    PCP:  None   Treatment Team: Attending Provider: Tera Helper, MD        Subjective:     CC:  Seizures      HPI:   Patient is a 48yo M with hx alcohol abuse, seizures who presents to ER after multiple seizures at home.  Patient postictal and unable to contribute meaningfully to history.   Accompanied by sister who provides history.    Found by sister on floor this morning confused, possibly postictal.  Started to feel better then had generalized seizure while face-down on the floor. Called EMS.  Per EMS, had another seizure in route, received versed.     The patient is an active abuser of alcohol, 12 or more beers daily and an unknown amount of liquor.  Since last hospitalization, has been making attempts to stop but has been having intermittent heavy drinking.  She does report shakes when he stops  drinking.  She has no idea how much he has been drinking in the past few days.   ??   There is also a strong family history of epilepsy in father and siblings. The patient was started on keppra in ER here but not taking it and does not seem to have had formal neuro eval for epilepsy.    ??   Patient wakes up, asks where he is, denies remembering anything this morning, and falls back asleep. Not tremulous, denies shaking or anxiety.      2/24: Patient states "he feels awful".  He cannot be any more specific but she states he feels rundown.  He denies any other seizure activity.  No nausea or vomiting.  No fevers or chills.  He states he does not have alcohol  withdrawal when he quits drinking.   Patient has not had any further seizure activity since admission.  Is a very poor historian.      Other than that mentioned above a 10 point review of systems is limited due to patient being a poor historian           Objective:        Patient Vitals for the past 24 hrs:            Temp  Pulse  Resp  BP  SpO2            09/08/18 0757  98 ??F (36.7 ??C)  97  18  135/90  94 %            09/08/18 0400  98 ??F (36.7 ??C)  94  18  144/90  98 %     09/08/18 0000  97.6 ??F (36.4 ??  C)  89  18  (!) 144/93  98 %     09/07/18 2000  98.7 ??F (37.1 ??C)  (!) 106  17  127/82  93 %     09/07/18 1600  99 ??F (37.2 ??C)  100  16  (!) 166/93  96 %     09/07/18 1502  98.6 ??F (37 ??C)  91  15  142/88  93 %     09/07/18 1422  --  99  --  145/87  --     09/07/18 1352  --  95  --  146/90  --     09/07/18 1350  --  92  --  --  95 %     09/07/18 1325  --  90  --  (!) 149/92  99 %     09/07/18 1110  --  --  --  125/77  --            09/07/18 1105  --  (!) 112  12  --  95 %        Oxygen Therapy   O2 Sat (%): 94 % (09/08/18 0757)   Pulse via Oximetry: 93 beats per minute (09/07/18 1350)      Intake/Output Summary (Last 24 hours) at 09/08/2018 1035   Last data filed at 09/07/2018 1430     Gross per 24 hour        Intake  1100 ml        Output  --        Net  1100 ml             General:    Well nourished.  Alert.  NAD, flat affect   OP:    Moist muc mb   CV:   Mildly tachycardic.  No murmur, rub, or gallop.   Lungs:   CTAB.  No wheezing, rhonchi, or rales.   Abdomen:   Soft, nontender, nondistended.    Extremities: Warm and dry.  No cyanosis or edema.    Skin:     No rashes or jaundice.    Neuro:   Nonfocal      Data Review:   I have reviewed all labs, meds, telemetry events, and studies from the last 24 hours.        Recent Results (from the past 24 hour(s))     CBC WITH AUTOMATED DIFF          Collection Time: 09/07/18 11:15 AM         Result  Value  Ref Range            WBC  6.2  4.3 - 11.1 K/uL       RBC  4.62  4.23  - 5.6 M/uL       HGB  15.4  13.6 - 17.2 g/dL       HCT  49.2  41.1 - 50.3 %       MCV  106.5 (H)  79.6 - 97.8 FL       MCH  33.3 (H)  26.1 - 32.9 PG       MCHC  31.3 (L)  31.4 - 35.0 g/dL       RDW  12.8  11.9 - 14.6 %       PLATELET  171  150 - 450 K/uL       MPV  10.2  9.4 - 12.3 FL       ABSOLUTE NRBC  0.00  0.0 - 0.2 K/uL       DF  AUTOMATED          NEUTROPHILS  51  43 - 78 %       LYMPHOCYTES  39  13 - 44 %       MONOCYTES  7  4.0 - 12.0 %       EOSINOPHILS  2  0.5 - 7.8 %       BASOPHILS  1  0.0 - 2.0 %       IMMATURE GRANULOCYTES  1  0.0 - 5.0 %       ABS. NEUTROPHILS  3.2  1.7 - 8.2 K/UL       ABS. LYMPHOCYTES  2.4  0.5 - 4.6 K/UL       ABS. MONOCYTES  0.5  0.1 - 1.3 K/UL       ABS. EOSINOPHILS  0.1  0.0 - 0.8 K/UL       ABS. BASOPHILS  0.0  0.0 - 0.2 K/UL       ABS. IMM. GRANS.  0.0  0.0 - 0.5 K/UL       METABOLIC PANEL, COMPREHENSIVE          Collection Time: 09/07/18 11:15 AM         Result  Value  Ref Range            Sodium  141  136 - 145 mmol/L       Potassium  4.4  3.5 - 5.1 mmol/L       Chloride  107  98 - 107 mmol/L       CO2  7 (LL)  21 - 32 mmol/L       Anion gap  27 (H)  7 - 16 mmol/L       Glucose  124 (H)  65 - 100 mg/dL       BUN  8  6 - 23 MG/DL       Creatinine  1.31  0.8 - 1.5 MG/DL       GFR est AA  >60  >60 ml/min/1.69m       GFR est non-AA  >60  >60 ml/min/1.719m      Calcium  9.5  8.3 - 10.4 MG/DL       Bilirubin, total  0.5  0.2 - 1.1 MG/DL       ALT (SGPT)  144 (H)  12 - 65 U/L       AST (SGOT)  178 (H)  15 - 37 U/L       Alk. phosphatase  181 (H)  50 - 136 U/L       Protein, total  9.3 (H)  6.3 - 8.2 g/dL       Albumin  4.2  3.5 - 5.0 g/dL       Globulin  5.1 (H)  2.3 - 3.5 g/dL       A-G Ratio  0.8 (L)  1.2 - 3.5         ETHYL ALCOHOL          Collection Time: 09/07/18 11:15 AM         Result  Value  Ref Range            ALCOHOL(ETHYL),SERUM  12  MG/DL       MAGNESIUM          Collection Time: 09/07/18 11:15 AM  Result  Value  Ref Range            Magnesium  2.4  1.8 -  2.4 mg/dL       EKG, 12 LEAD, INITIAL          Collection Time: 09/07/18 11:23 AM         Result  Value  Ref Range            Ventricular Rate  122  BPM       Atrial Rate  122  BPM       P-R Interval  138  ms       QRS Duration  76  ms       Q-T Interval  300  ms       QTC Calculation (Bezet)  427  ms       Calculated P Axis  82  degrees       Calculated R Axis  81  degrees       Calculated T Axis  64  degrees       Diagnosis                 Sinus tachycardia with Premature supraventricular complexes   Biatrial enlargement   Abnormal ECG   When compared with ECG of 07-Sep-2018 11:17,   Premature supraventricular complexes are now Present   Confirmed by MATHIAS, MD ([facility]), [facility] [phone]) on 09/07/2018 2:36:03 PM          DRUG SCREEN, URINE          Collection Time: 09/07/18 12:36 PM         Result  Value  Ref Range            PCP(PHENCYCLIDINE)  NEGATIVE           BENZODIAZEPINES  POSITIVE          COCAINE  NEGATIVE           AMPHETAMINES  NEGATIVE           METHADONE  NEGATIVE           THC (TH-CANNABINOL)  NEGATIVE           OPIATES  NEGATIVE           BARBITURATES  NEGATIVE           POC G3          Collection Time: 09/07/18 12:54 PM         Result  Value  Ref Range            Device:  ROOM AIR          FIO2 (POC)  21  %       pH (POC)  7.292 (L)  7.35 - 7.45         pCO2 (POC)  40.4  35 - 45 MMHG       pO2 (POC)  68 (L)  75 - 100 MMHG       HCO3 (POC)  19.5 (L)  22 - 26 MMOL/L       sO2 (POC)  91 (L)  95 - 98 %       Base deficit (POC)  7  mmol/L       Allens test (POC)  YES          Site  RIGHT RADIAL          Specimen type (POC)  ARTERIAL  Performed by  HoshkoDavisRT         CO2, POC  21  MMOL/L       Respiratory comment:  PhysicianNotified         COLLECT TIME  1,250          LACTIC ACID          Collection Time: 09/07/18  3:28 PM         Result  Value  Ref Range            Lactic acid  1.9  0.4 - 2.0 MMOL/L       METABOLIC PANEL, BASIC          Collection Time: 09/08/18  5:48 AM         Result  Value  Ref  Range            Sodium  139  136 - 145 mmol/L       Potassium  3.5  3.5 - 5.1 mmol/L       Chloride  108 (H)  98 - 107 mmol/L       CO2  25  21 - 32 mmol/L       Anion gap  6 (L)  7 - 16 mmol/L       Glucose  82  65 - 100 mg/dL       BUN  5 (L)  6 - 23 MG/DL       Creatinine  0.81  0.8 - 1.5 MG/DL       GFR est AA  >60  >60 ml/min/1.58m       GFR est non-AA  >60  >60 ml/min/1.752m           Calcium  9.1  8.3 - 10.4 MG/DL              All Micro Results           None                  Current Meds:     Current Facility-Administered Medications          Medication  Dose  Route  Frequency           ?  sodium chloride (NS) flush 5-40 mL   5-40 mL  IntraVENous  Q8H     ?  sodium chloride (NS) flush 5-40 mL   5-40 mL  IntraVENous  PRN           ?  levETIRAcetam (KEPPRA) 1,000 mg in 0.9% sodium chloride 100 mL IVPB   1,000 mg  IntraVENous  Q12H           ?  acetaminophen (TYLENOL) tablet 650 mg   650 mg  Oral  Q4H PRN     ?  HYDROcodone-acetaminophen (NORCO) 5-325 mg per tablet 1 Tab   1 Tab  Oral  Q4H PRN     ?  ondansetron (ZOFRAN ODT) tablet 4 mg   4 mg  Oral  Q4H PRN     ?  bisacodyL (DULCOLAX) tablet 5 mg   5 mg  Oral  DAILY PRN     ?  LORazepam (ATIVAN) injection 2 mg   2 mg  IntraVENous  Q2H PRN           ?  heparin (porcine) injection 5,000 Units   5,000 Units  SubCUTAneous  Q8H           Other Studies (  last 24 hours):   Ct Head Wo Cont      Result Date: 09/07/2018   History: seizures, fall Exam: CT head without contrast Technique: Thin section axial CT images were obtained from the skullbase through the vertex. Radiation dose reduction techniques were used for this study.  Our CT scanners use one or all of the following:  Automated exposure control, adjustment of the mA and/or kV according to patient size, use of iterative reconstruction. COMPARISON: 08/05/2018 Findings: The ventricles are normal in size, shape, and position. No new abnormal parenchymal density is present.  No extra-axial fluid collection is  present. There is no mass or mass-effect. The basilar cisterns are patent. The paranasal sinuses and mastoid air cells are clear.       Impression: Stable CT head without contrast. No acute abnormality.            Assessment and Plan:           Hospital Problems as of  09/08/2018  Never Reviewed                         Codes  Class  Noted - Resolved  POA              Seizure ([place])  ICD-10-CM: R56.9   ICD-9-CM: 780.39    09/07/2018 - Present  Yes                        Alcohol abuse  ICD-10-CM: F10.10   ICD-9-CM: 305.00    09/07/2018 - Present  Yes                        Metabolic acidosis  KKX-38-HW: E87.2   ICD-9-CM: 276.2    09/07/2018 - Present  Yes                          PLAN:        Seizures   -Due to noncompliance with medications, as well as ongoing alcohol use   -Continue with Keppra, continue to monitor   -change keppra to po once more awake      Chronic regular ETOH abuse   -Patient has been counseled.  He does not appear to be motivated to quit drinking.   -Continue with withdrawal precautions, vitamin replacement      Tobacco abuse   -counsel      Medication noncompliance   -counsel      DC planning/Dispo:     DVT ppx:        Signed:   Tera Helper, MD

## 2018-09-08 NOTE — Progress Notes (Signed)
CM reviewed chart and noted patient is self pay and has no PCP.  CM met with patient who is alert, sitting in bed and is feeding self lunch.  Patient states that he lives in his father's home with his sister and they assist their father who was also diagnosed recently with a seizure disorder.  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  The agency provides transportation to the job site.  Patient does not have a PCP and does not have insurance.  Discussed potential need for new medication related to seizures and the importance of taking them.  Patient is open to a referral to the HOP program.  Patient's information emailed to HOP liaisons Brady McMahan and Miranda Lilley.  Reviewed the information that he would need to take to appointment.  CM will continue to follow up after hearing from HOP liaisons.     Care Management Interventions  PCP Verified by CM: (No current PCP.  HOP consult placed.)  Transition of Care Consult (CM Consult): Discharge Planning(Patient is self pay and has no PCP and new seizure diagnosis.)  Current Support Network: Relative's Home(Lives with sister and father in father's home)  Confirm Follow Up Transport: Family  Discharge Location  Discharge Placement: Home

## 2018-09-08 NOTE — Progress Notes (Signed)
Hospitalist Progress Note     Admit Date:  09/07/2018 11:02 AM   Name:  Barry Gregory   Age:  48 y.o.  DOB:  1970/08/12   MRN:  [id number]   PCP:  None  Treatment Team: Attending Provider: Tera Helper, MD    Subjective:   CC:  Seizures    HPI:  Patient is a 48yo M with hx alcohol abuse, seizures who presents to ER after multiple seizures at home.  Patient postictal and unable to contribute meaningfully to history.  Accompanied by sister who provides history.   Found by sister on floor this morning confused, possibly postictal.  Started to feel better then had generalized seizure while face-down on the floor. Called EMS.  Per EMS, had another seizure in route, received versed.    The patient is an active abuser of alcohol, 12 or more beers daily and an unknown amount of liquor.  Since last hospitalization, has been making attempts to stop but has been having intermittent heavy drinking.  She does report shakes when he stops drinking.  She has no idea how much he has been drinking in the past few days.  ??  There is also a strong family history of epilepsy in father and siblings. The patient was started on keppra in ER here but not taking it and does not seem to have had formal neuro eval for epilepsy.   ??  Patient wakes up, asks where he is, denies remembering anything this morning, and falls back asleep. Not tremulous, denies shaking or anxiety.    2/24: Patient states "he feels awful".  He cannot be any more specific but she states he feels rundown.  He denies any other seizure activity.  No nausea or vomiting.  No fevers or chills.  He states he does not have alcohol withdrawal when he quits drinking.  Patient has not had any further seizure activity since admission.  Is a very poor historian.    Other than that mentioned above a 10 point review of systems is limited due to patient being a poor historian      Objective:     Patient Vitals for the past 24 hrs:   Temp Pulse Resp BP SpO2    09/08/18 0757 98 ??F (36.7 ??C) 97 18 135/90 94 %   09/08/18 0400 98 ??F (36.7 ??C) 94 18 144/90 98 %   09/08/18 0000 97.6 ??F (36.4 ??C) 89 18 (!) 144/93 98 %   09/07/18 2000 98.7 ??F (37.1 ??C) (!) 106 17 127/82 93 %   09/07/18 1600 99 ??F (37.2 ??C) 100 16 (!) 166/93 96 %   09/07/18 1502 98.6 ??F (37 ??C) 91 15 142/88 93 %   09/07/18 1422 ??? 99 ??? 145/87 ???   09/07/18 1352 ??? 95 ??? 146/90 ???   09/07/18 1350 ??? 92 ??? ??? 95 %   09/07/18 1325 ??? 90 ??? (!) 149/92 99 %   09/07/18 1110 ??? ??? ??? 125/77 ???   09/07/18 1105 ??? (!) 112 12 ??? 95 %     Oxygen Therapy  O2 Sat (%): 94 % (09/08/18 0757)  Pulse via Oximetry: 93 beats per minute (09/07/18 1350)    Intake/Output Summary (Last 24 hours) at 09/08/2018 1035  Last data filed at 09/07/2018 1430  Gross per 24 hour   Intake 1100 ml   Output ???   Net 1100 ml         General:    Well nourished.  Alert.  NAD, flat affect  OP:    Moist muc mb  CV:   Mildly tachycardic.  No murmur, rub, or gallop.  Lungs:   CTAB.  No wheezing, rhonchi, or rales.  Abdomen:   Soft, nontender, nondistended.   Extremities: Warm and dry.  No cyanosis or edema.   Skin:     No rashes or jaundice.   Neuro:   Nonfocal    Data Review:  I have reviewed all labs, meds, telemetry events, and studies from the last 24 hours.    Recent Results (from the past 24 hour(s))   CBC WITH AUTOMATED DIFF    Collection Time: 09/07/18 11:15 AM   Result Value Ref Range    WBC 6.2 4.3 - 11.1 K/uL    RBC 4.62 4.23 - 5.6 M/uL    HGB 15.4 13.6 - 17.2 g/dL    HCT 49.2 41.1 - 50.3 %    MCV 106.5 (H) 79.6 - 97.8 FL    MCH 33.3 (H) 26.1 - 32.9 PG    MCHC 31.3 (L) 31.4 - 35.0 g/dL    RDW 12.8 11.9 - 14.6 %    PLATELET 171 150 - 450 K/uL    MPV 10.2 9.4 - 12.3 FL    ABSOLUTE NRBC 0.00 0.0 - 0.2 K/uL    DF AUTOMATED      NEUTROPHILS 51 43 - 78 %    LYMPHOCYTES 39 13 - 44 %    MONOCYTES 7 4.0 - 12.0 %    EOSINOPHILS 2 0.5 - 7.8 %    BASOPHILS 1 0.0 - 2.0 %    IMMATURE GRANULOCYTES 1 0.0 - 5.0 %    ABS. NEUTROPHILS 3.2 1.7 - 8.2 K/UL     ABS. LYMPHOCYTES 2.4 0.5 - 4.6 K/UL    ABS. MONOCYTES 0.5 0.1 - 1.3 K/UL    ABS. EOSINOPHILS 0.1 0.0 - 0.8 K/UL    ABS. BASOPHILS 0.0 0.0 - 0.2 K/UL    ABS. IMM. GRANS. 0.0 0.0 - 0.5 K/UL   METABOLIC PANEL, COMPREHENSIVE    Collection Time: 09/07/18 11:15 AM   Result Value Ref Range    Sodium 141 136 - 145 mmol/L    Potassium 4.4 3.5 - 5.1 mmol/L    Chloride 107 98 - 107 mmol/L    CO2 7 (LL) 21 - 32 mmol/L    Anion gap 27 (H) 7 - 16 mmol/L    Glucose 124 (H) 65 - 100 mg/dL    BUN 8 6 - 23 MG/DL    Creatinine 1.31 0.8 - 1.5 MG/DL    GFR est AA >60 >60 ml/min/1.81m    GFR est non-AA >60 >60 ml/min/1.772m   Calcium 9.5 8.3 - 10.4 MG/DL    Bilirubin, total 0.5 0.2 - 1.1 MG/DL    ALT (SGPT) 144 (H) 12 - 65 U/L    AST (SGOT) 178 (H) 15 - 37 U/L    Alk. phosphatase 181 (H) 50 - 136 U/L    Protein, total 9.3 (H) 6.3 - 8.2 g/dL    Albumin 4.2 3.5 - 5.0 g/dL    Globulin 5.1 (H) 2.3 - 3.5 g/dL    A-G Ratio 0.8 (L) 1.2 - 3.5     ETHYL ALCOHOL    Collection Time: 09/07/18 11:15 AM   Result Value Ref Range    ALCOHOL(ETHYL),SERUM 12 MG/DL   MAGNESIUM    Collection Time: 09/07/18 11:15 AM   Result Value Ref Range  Magnesium 2.4 1.8 - 2.4 mg/dL   EKG, 12 LEAD, INITIAL    Collection Time: 09/07/18 11:23 AM   Result Value Ref Range    Ventricular Rate 122 BPM    Atrial Rate 122 BPM    P-R Interval 138 ms    QRS Duration 76 ms    Q-T Interval 300 ms    QTC Calculation (Bezet) 427 ms    Calculated P Axis 82 degrees    Calculated R Axis 81 degrees    Calculated T Axis 64 degrees    Diagnosis       Sinus tachycardia with Premature supraventricular complexes  Biatrial enlargement  Abnormal ECG  When compared with ECG of 07-Sep-2018 11:17,  Premature supraventricular complexes are now Present  Confirmed by MATHIAS, MD ([facility]), [facility]AW[facility] [phone]) on 09/07/2018 2:36:03 PM     DRUG SCREEN, URINE    Collection Time: 09/07/18 12:36 PM   Result Value Ref Range    PCP(PHENCYCLIDINE) NEGATIVE       BENZODIAZEPINES POSITIVE      COCAINE NEGATIVE        AMPHETAMINES NEGATIVE       METHADONE NEGATIVE       THC (TH-CANNABINOL) NEGATIVE       OPIATES NEGATIVE       BARBITURATES NEGATIVE      POC G3    Collection Time: 09/07/18 12:54 PM   Result Value Ref Range    Device: ROOM AIR      FIO2 (POC) 21 %    pH (POC) 7.292 (L) 7.35 - 7.45      pCO2 (POC) 40.4 35 - 45 MMHG    pO2 (POC) 68 (L) 75 - 100 MMHG    HCO3 (POC) 19.5 (L) 22 - 26 MMOL/L    sO2 (POC) 91 (L) 95 - 98 %    Base deficit (POC) 7 mmol/L    Allens test (POC) YES      Site RIGHT RADIAL      Specimen type (POC) ARTERIAL      Performed by [email or web address]     CO2, POC 21 MMOL/L    Respiratory comment: PhysicianNotified     COLLECT TIME 1,250     LACTIC ACID    Collection Time: 09/07/18  3:28 PM   Result Value Ref Range    Lactic acid 1.9 0.4 - 2.0 MMOL/L   METABOLIC PANEL, BASIC    Collection Time: 09/08/18  5:48 AM   Result Value Ref Range    Sodium 139 136 - 145 mmol/L    Potassium 3.5 3.5 - 5.1 mmol/L    Chloride 108 (H) 98 - 107 mmol/L    CO2 25 21 - 32 mmol/L    Anion gap 6 (L) 7 - 16 mmol/L    Glucose 82 65 - 100 mg/dL    BUN 5 (L) 6 - 23 MG/DL    Creatinine 0.81 0.8 - 1.5 MG/DL    GFR est AA >60 >60 ml/min/1.71m    GFR est non-AA >60 >60 ml/min/1.717m   Calcium 9.1 8.3 - 10.4 MG/DL        All Micro Results     None          Current Meds:  Current Facility-Administered Medications   Medication Dose Route Frequency   ??? sodium chloride (NS) flush 5-40 mL  5-40 mL IntraVENous Q8H   ??? sodium chloride (NS) flush 5-40 mL  5-40 mL IntraVENous PRN   ??? levETIRAcetam (KEPPRA)  1,000 mg in 0.9% sodium chloride 100 mL IVPB  1,000 mg IntraVENous Q12H   ??? acetaminophen (TYLENOL) tablet 650 mg  650 mg Oral Q4H PRN   ??? HYDROcodone-acetaminophen (NORCO) 5-325 mg per tablet 1 Tab  1 Tab Oral Q4H PRN   ??? ondansetron (ZOFRAN ODT) tablet 4 mg  4 mg Oral Q4H PRN   ??? bisacodyL (DULCOLAX) tablet 5 mg  5 mg Oral DAILY PRN   ??? LORazepam (ATIVAN) injection 2 mg  2 mg IntraVENous Q2H PRN    ??? heparin (porcine) injection 5,000 Units  5,000 Units SubCUTAneous Q8H       Other Studies (last 24 hours):  Ct Head Wo Cont    Result Date: 09/07/2018  History: seizures, fall Exam: CT head without contrast Technique: Thin section axial CT images were obtained from the skullbase through the vertex. Radiation dose reduction techniques were used for this study.  Our CT scanners use one or all of the following: Automated exposure control, adjustment of the mA and/or kV according to patient size, use of iterative reconstruction. COMPARISON: 08/05/2018 Findings: The ventricles are normal in size, shape, and position. No new abnormal parenchymal density is present. No extra-axial fluid collection is present. There is no mass or mass-effect. The basilar cisterns are patent. The paranasal sinuses and mastoid air cells are clear.     Impression: Stable CT head without contrast. No acute abnormality.       Assessment and Plan:     Hospital Problems as of 09/08/2018 Never Reviewed          Codes Class Noted - Resolved POA    Seizure ([place]) ICD-10-CM: R56.9  ICD-9-CM: 780.39  09/07/2018 - Present Yes        Alcohol abuse ICD-10-CM: F10.10  ICD-9-CM: 305.00  09/07/2018 - Present Yes        Metabolic acidosis ZOX-09-UE: E87.2  ICD-9-CM: 276.2  09/07/2018 - Present Yes              PLAN:      Seizures  -Due to noncompliance with medications, as well as ongoing alcohol use  -Continue with Keppra, continue to monitor  -change keppra to po once more awake    Chronic regular ETOH abuse  -Patient has been counseled.  He does not appear to be motivated to quit drinking.  -Continue with withdrawal precautions, vitamin replacement    Tobacco abuse  -counsel    Medication noncompliance  -counsel    DC planning/Dispo:    DVT ppx:      Signed:  Tera Helper, MD

## 2018-09-08 NOTE — Progress Notes (Signed)
CM reviewed chart and noted patient is self pay and has no PCP.  CM met with patient who is alert, sitting in bed and is feeding self lunch.  Patient states that he lives in his father's home with his sister and they assist their father who was also diagnosed recently with a seizure disorder.  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  The agency provides transportation to the job site.  Patient does not have a PCP and does not have insurance.  Discussed potential need for new medication related to seizures and the importance of taking them.  Patient is open to a referral to the [facility] program.  Patient's information emailed to [facility] liaisons Brady McMahan and [place].  Reviewed the information that he would need to take to appointment.  CM will continue to follow up after hearing from [facility] liaisons.     Care Management Interventions  PCP Verified by CM: (No current PCP.  HOP consult placed.)  Transition of Care Consult (CM Consult): Discharge Planning(Patient is self pay and has no PCP and new seizure diagnosis.)  Current Support Network: Relative's Home(Lives with sister and father in father's home)  Confirm Follow Up Transport: Family  Discharge Location  Discharge Placement: Home

## 2018-09-09 LAB — COMPREHENSIVE METABOLIC PANEL
ALT: 95 U/L — ABNORMAL HIGH (ref 12–65)
AST: 127 U/L — ABNORMAL HIGH (ref 15–37)
Albumin/Globulin Ratio: 0.8 — ABNORMAL LOW (ref 1.2–3.5)
Albumin: 3.5 g/dL (ref 3.5–5.0)
Alkaline Phosphatase: 116 U/L (ref 50–136)
Anion Gap: 7 mmol/L (ref 7–16)
BUN: 5 MG/DL — ABNORMAL LOW (ref 6–23)
CO2: 25 mmol/L (ref 21–32)
Calcium: 9.1 MG/DL (ref 8.3–10.4)
Chloride: 106 mmol/L (ref 98–107)
Creatinine: 0.8 MG/DL (ref 0.8–1.5)
EGFR IF NonAfrican American: >60 mL/min/{1.73_m2} (ref >60)
GFR African American: >60 mL/min/{1.73_m2} (ref >60)
Globulin: 4.3 g/dL — ABNORMAL HIGH (ref 2.3–3.5)
Glucose: 86 mg/dL (ref 65–100)
Potassium: 3.7 mmol/L (ref 3.5–5.1)
Sodium: 138 mmol/L (ref 136–145)
Total Bilirubin: 1.4 MG/DL — ABNORMAL HIGH (ref 0.2–1.1)
Total Protein: 7.8 g/dL (ref 6.3–8.2)

## 2018-09-09 LAB — CBC
Hematocrit: 41.7 % (ref 41.1–50.3)
Hemoglobin: 14.5 g/dL (ref 13.6–17.2)
MCH: 33.6 PG — ABNORMAL HIGH (ref 26.1–32.9)
MCHC: 34.8 g/dL (ref 31.4–35.0)
MCV: 96.5 FL (ref 79.6–97.8)
MPV: 10.8 FL (ref 9.4–12.3)
NRBC Absolute: 0 10*3/uL (ref 0.0–0.2)
Platelets: 143 10*3/uL — ABNORMAL LOW (ref 150–450)
RBC: 4.32 M/uL (ref 4.23–5.6)
RDW: 12.1 % (ref 11.9–14.6)
WBC: 3.6 10*3/uL — ABNORMAL LOW (ref 4.3–11.1)

## 2018-09-09 LAB — TSH 3RD GENERATION
TSH: 2.18 u[IU]/mL (ref 0.358–3.740)
TSH: 2.18 u[IU]/mL (ref 0.358–3.740)

## 2018-09-09 LAB — METABOLIC PANEL, COMPREHENSIVE
A-G Ratio: 0.8 — ABNORMAL LOW (ref 1.2–3.5)
ALT (SGPT): 95 U/L — ABNORMAL HIGH (ref 12–65)
AST (SGOT): 127 U/L — ABNORMAL HIGH (ref 15–37)
Albumin: 3.5 g/dL (ref 3.5–5.0)
Alk. phosphatase: 116 U/L (ref 50–136)
Anion gap: 7 mmol/L (ref 7–16)
BUN: 5 MG/DL — ABNORMAL LOW (ref 6–23)
Bilirubin, total: 1.4 MG/DL — ABNORMAL HIGH (ref 0.2–1.1)
CO2: 25 mmol/L (ref 21–32)
Calcium: 9.1 MG/DL (ref 8.3–10.4)
Chloride: 106 mmol/L (ref 98–107)
Creatinine: 0.8 MG/DL (ref 0.8–1.5)
GFR est AA: >60 mL/min/{1.73_m2} (ref >60)
GFR est non-AA: >60 mL/min/{1.73_m2} (ref >60)
Globulin: 4.3 g/dL — ABNORMAL HIGH (ref 2.3–3.5)
Glucose: 86 mg/dL (ref 65–100)
Potassium: 3.7 mmol/L (ref 3.5–5.1)
Protein, total: 7.8 g/dL (ref 6.3–8.2)
Sodium: 138 mmol/L (ref 136–145)

## 2018-09-09 LAB — CBC W/O DIFF
ABSOLUTE NRBC: 0 10*3/uL (ref 0.0–0.2)
HCT: 41.7 % (ref 41.1–50.3)
HGB: 14.5 g/dL (ref 13.6–17.2)
MCH: 33.6 PG — ABNORMAL HIGH (ref 26.1–32.9)
MCHC: 34.8 g/dL (ref 31.4–35.0)
MCV: 96.5 FL (ref 79.6–97.8)
MPV: 10.8 FL (ref 9.4–12.3)
PLATELET: 143 10*3/uL — ABNORMAL LOW (ref 150–450)
RBC: 4.32 M/uL (ref 4.23–5.6)
RDW: 12.1 % (ref 11.9–14.6)
WBC: 3.6 10*3/uL — ABNORMAL LOW (ref 4.3–11.1)

## 2018-09-09 MED ORDER — HYDRALAZINE 20 MG/ML IJ SOLN
20 mg/mL | Freq: Four times a day (QID) | INTRAMUSCULAR | Status: DC | PRN
Start: 2018-09-09 — End: 2018-09-09

## 2018-09-09 MED ORDER — FLU VACCINE QV 2019-20 (6 MOS+)(PF) 60 MCG (15 MCG X 4)/0.5 ML IM SYRINGE
60 mcg (15 mcg x 4)/0.5 mL | INTRAMUSCULAR | Status: DC
Start: 2018-09-09 — End: 2018-09-09

## 2018-09-09 MED FILL — LEVETIRACETAM 500 MG/5 ML IV SOLN: 500 mg/5 mL | INTRAVENOUS | Qty: 10

## 2018-09-09 MED FILL — HEPARIN (PORCINE) 5,000 UNIT/ML IJ SOLN: 5000 unit/mL | INTRAMUSCULAR | Qty: 1

## 2018-09-09 NOTE — Progress Notes (Signed)
Patient provided appointment information for start of care with Barry Gregory at [facility].  Appointment is at 10:30 on March 3rd.  Added to follow up in CC for discharge nurse to also review.

## 2018-09-09 NOTE — Discharge Summary (Signed)
Discharge Summary by Reatha Armour, MD at 09/09/18 1001                Author: Reatha Armour, MD  Service: Internal Medicine  Author Type: Physician       Filed: 09/09/18 1006  Date of Service: 09/09/18 1001  Status: Signed          Editor: Reatha Armour, MD (Physician)                             Hospitalist Discharge Summary        Admit Date:  09/07/2018 11:02 AM    Name:  Brigitte Pulse Hun    Age:  48 y.o.   DOB:  1970-12-06    MRN:  [id number]    PCP:  None   Treatment Team: Attending Provider: Reatha Armour, MD; Care Manager: Lolita Patella, RN; Utilization Review: Brand Males, RN; Charge Nurse: [employer]      Problem List for this Hospitalization:      Hospital Problems as of  09/09/2018  Never Reviewed                         Codes  Class  Noted - Resolved  POA              Seizure (HCC)  ICD-10-CM: R56.9   ICD-9-CM: 780.39    09/07/2018 - Present  Yes                        Alcohol abuse  ICD-10-CM: F10.10   ICD-9-CM: 305.00    09/07/2018 - Present  Yes                        Metabolic acidosis  ICD-10-CM: B14.7   ICD-9-CM: 276.2    09/07/2018 - Present  Yes                             Admission HPI from 09/07/2018:        Patient is a 48yo M with hx alcohol abuse, seizures who presents to ER after multiple seizures at home.  Patient postictal and unable to contribute meaningfully  to history.  Accompanied by sister who provides history.    Found by sister on floor this morning confused, possibly postictal.  Started to feel better then had generalized seizure while face-down on the floor. Called EMS.  Per EMS, had another seizure in route, received versed.     The patient is an active abuser of alcohol, 12 or more beers daily and an unknown amount of liquor.  Since last hospitalization, has been making attempts to stop but has been having intermittent heavy drinking.  She does report shakes when he stops  drinking.  She has no idea how much he has been drinking in the past few  days.   ??   There is also a strong family history of epilepsy in father and siblings. The patient was started on keppra in ER here but not taking it at home.      Hospital Course:      Patient had an uneventful hospital course.  He did not show any signs of alcohol withdrawal.  Since resuming Keppra, he did not have any further seizure-like activity.  He was counseled  on the importance  of compliance with his medications.  He was also counseled on the avoidance of alcohol, and the role this has to worsen his risk for seizures.  Patient voiced understanding of this.  He was offered resources.  Patient is stable for discharge.  He was  told absolutely not to drive at all and he voiced understanding of this.  Patient follow-up with primary care provider this week, and neurology as well.      Patient did have elevated liver enzymes, likely due to alcohol.  He will need to follow this up with his primary care provider as well with a repeat CMP.         Follow up instructions below.  Plan was discussed with pt.  All questions answered.   Patient was stable at time of discharge and was instructed to call or return if there are any concerns or recurrence of symptoms.      Diagnostic Imaging/Tests:    Ct Head Wo Cont      Result Date: 09/07/2018   History: seizures, fall Exam: CT head without contrast Technique: Thin section axial CT images were obtained from the skullbase through the vertex. Radiation dose reduction techniques were used for this study.  Our CT scanners use one or all of the following:  Automated exposure control, adjustment of the mA and/or kV according to patient size, use of iterative reconstruction. COMPARISON: 08/05/2018 Findings: The ventricles are normal in size, shape, and position. No new abnormal parenchymal density is present.  No extra-axial fluid collection is present. There is no mass or mass-effect. The basilar cisterns are patent. The paranasal sinuses and mastoid air cells are clear.        Impression: Stable CT head without contrast. No acute abnormality.          Echocardiogram results:   No results found for this visit on 09/07/18.           All Micro Results           None                     Labs:  Results:                  BMP, Mg, Phos  Recent Labs         09/08/18   0548  09/07/18   1115      NA  139  141      K  3.5  4.4      CL  108*  107      CO2  25  7*      AGAP  6*  27*      BUN  5*  8      CREA  0.81  1.31      CA  9.1  9.5      GLU  82  124*      MG   --   2.4                 CBC  Recent Labs         09/07/18   1115      WBC  6.2      RBC  4.62      HGB  15.4      HCT  49.2      PLT  171      GRANS  51      LYMPH  39      EOS  2      MONOS  7      BASOS  1      IG  1      ANEU  3.2      ABL  2.4      ABE  0.1      ABM  0.5      ABB  0.0      AIG  0.0                 LFT  Recent Labs         09/07/18   1115      SGOT  178*      ALT  144*      AP  181*      TP  9.3*      ALB  4.2      GLOB  5.1*      AGRAT  0.8*                 Cardiac Testing  No results found for: BNPP, BNP, CPK, RCK1, RCK2, RCK3, RCK4, CKMB, CKNDX, CKND1, TROPT, TROIQ     Coagulation Tests  No results found for: PTP, INR, APTT, INREXT     A1c  No results found for: HBA1C, HGBE8, HBA1CEXT     Lipid Panel  No results found for: CHOL, CHOLPOCT, CHOLX, CHLST, CHOLV, 884269, HDL, HDLP, LDL, LDLC, DLDLP, 892119, VLDLC, VLDL, TGLX, TRIGL, TRIGP, TGLPOCT, CHHD,  CHHDX     Thyroid Panel  No results found for: T4, T3U, TSH, TSHEXT       Most Recent UA  No results found for: COLOR, APPRN, REFSG, PHU, PROTU, GLUCU, KETU, BILU, BLDU, UROU, NITU, LEUKU        No Known Allergies     Immunization History        Administered  Date(s) Administered         ?  Tdap  01/24/2016           All Labs from Last 24 Hrs:   No results found for this or any previous visit (from the past 24 hour(s)).      Discharge Exam:   Patient Vitals for the past 24 hrs:            Temp  Pulse  Resp  BP  SpO2            09/09/18 0723  98.2 ??F (36.8 ??C)  91  18   135/89  97 %            09/09/18 0237  98.1 ??F (36.7 ??C)  90  18  (!) 149/110  97 %     09/09/18 0000  97.8 ??F (36.6 ??C)  85  18  (!) 151/102  97 %     09/08/18 2000  98.5 ??F (36.9 ??C)  82  18  (!) 150/104  99 %     09/08/18 1616  98.1 ??F (36.7 ??C)  89  18  (!) 148/96  96 %            09/08/18 1215  98.6 ??F (37 ??C)  90  18  (!) 148/91  96 %        Oxygen Therapy   O2 Sat (%): 97 % (09/09/18 0723)   Pulse via Oximetry: 93 beats per minute (09/07/18 1350)   No intake or output data in the 24 hours  ending 09/09/18 1001     General:    Well nourished.  Alert.  No distress.   Eyes:   Normal sclera.  Extraocular movements intact.   ENT:  Normocephalic, atraumatic.  Moist mucous membranes   CV:   Regular rate and rhythm.  No murmur, rub, or gallop.     Lungs:  Clear to auscultation bilaterally.  No wheezing, rhonchi, or rales.   Abdomen: Soft, nontender, nondistended. Bowel sounds normal.    Extremities: Warm and dry.  No cyanosis or edema.   Neurologic: CN II-XII grossly intact.  Sensation intact.   Skin:     No rashes or jaundice.     Psych:  Normal mood and affect.      Discharge Info:      Current Discharge Medication List                     Disposition: home     Activity: No Driving at all!!!!!!   Diet: DIET REGULAR      No orders of the defined types were placed in this encounter.              Follow-up Information               Follow up With  Specialties  Details  Why  Contact Info              [facility]    Go on 09/16/2018  10:30 AM for setup with free clinic program  [address]   [place] [address]      [facility]   [phone]                Time spent in patient discharge planning and coordination 37 minutes.      Signed:   Reatha Armour, MD

## 2018-09-09 NOTE — Progress Notes (Signed)
Problem: Patient Education: Go to Patient Education Activity  Goal: Patient/Family Education  Outcome: Progressing Towards Goal     Problem: Pain  Goal: *Control of Pain  Outcome: Progressing Towards Goal     Problem: Patient Education: Go to Patient Education Activity  Goal: Patient/Family Education  Outcome: Progressing Towards Goal

## 2018-09-09 NOTE — Progress Notes (Signed)
Patient provided appointment information for start of care with [facility] at [facility].  Appointment is at 10:30 on March 3rd.  Added to follow up in CC for discharge nurse to also review.

## 2018-09-09 NOTE — Discharge Summary (Signed)
Hospitalist Discharge Summary     Admit Date:  09/07/2018 11:02 AM   Name:  Barry Gregory   Age:  48 y.o.  DOB:  03/31/1971   MRN:  [id number]   PCP:  None  Treatment Team: Attending Provider: Reatha Armour, MD; Care Manager: Lolita Patella, RN; Utilization Review: Brand Males, RN; Charge Nurse: [employer]    Problem List for this Hospitalization:  Hospital Problems as of 09/09/2018 Never Reviewed          Codes Class Noted - Resolved POA    Seizure (HCC) ICD-10-CM: R56.9  ICD-9-CM: 780.39  09/07/2018 - Present Yes        Alcohol abuse ICD-10-CM: F10.10  ICD-9-CM: 305.00  09/07/2018 - Present Yes        Metabolic acidosis ICD-10-CM: E87.2  ICD-9-CM: 276.2  09/07/2018 - Present Yes                Admission HPI from 09/07/2018:      Patient is a 48yo M with hx alcohol abuse, seizures who presents to ER after multiple seizures at home.  Patient postictal and unable to contribute meaningfully to history.  Accompanied by sister who provides history.   Found by sister on floor this morning confused, possibly postictal.  Started to feel better then had generalized seizure while face-down on the floor. Called EMS.  Per EMS, had another seizure in route, received versed.    The patient is an active abuser of alcohol, 12 or more beers daily and an unknown amount of liquor.  Since last hospitalization, has been making attempts to stop but has been having intermittent heavy drinking.  She does report shakes when he stops drinking.  She has no idea how much he has been drinking in the past few days.  ??  There is also a strong family history of epilepsy in father and siblings. The patient was started on keppra in ER here but not taking it at home.    Hospital Course:    Patient had an uneventful hospital course.  He did not show any signs of alcohol withdrawal.  Since resuming Keppra, he did not have any further seizure-like activity.  He was counseled on the importance of compliance  with his medications.  He was also counseled on the avoidance of alcohol, and the role this has to worsen his risk for seizures.  Patient voiced understanding of this.  He was offered resources.  Patient is stable for discharge.  He was told absolutely not to drive at all and he voiced understanding of this.  Patient follow-up with primary care provider this week, and neurology as well.    Patient did have elevated liver enzymes, likely due to alcohol.  He will need to follow this up with his primary care provider as well with a repeat CMP.      Follow up instructions below.  Plan was discussed with pt.  All questions answered.  Patient was stable at time of discharge and was instructed to call or return if there are any concerns or recurrence of symptoms.    Diagnostic Imaging/Tests:   Ct Head Wo Cont    Result Date: 09/07/2018  History: seizures, fall Exam: CT head without contrast Technique: Thin section axial CT images were obtained from the skullbase through the vertex. Radiation dose reduction techniques were used for this study.  Our CT scanners use one or all of the following: Automated exposure control, adjustment of the  mA and/or kV according to patient size, use of iterative reconstruction. COMPARISON: 08/05/2018 Findings: The ventricles are normal in size, shape, and position. No new abnormal parenchymal density is present. No extra-axial fluid collection is present. There is no mass or mass-effect. The basilar cisterns are patent. The paranasal sinuses and mastoid air cells are clear.     Impression: Stable CT head without contrast. No acute abnormality.       Echocardiogram results:  No results found for this visit on 09/07/18.      All Micro Results     None          Labs: Results:       BMP, Mg, Phos Recent Labs     09/08/18  0548 09/07/18  1115   NA 139 141   K 3.5 4.4   CL 108* 107   CO2 25 7*   AGAP 6* 27*   BUN 5* 8   CREA 0.81 1.31   CA 9.1 9.5   GLU 82 124*   MG  --  2.4      CBC Recent Labs      09/07/18  1115   WBC 6.2   RBC 4.62   HGB 15.4   HCT 49.2   PLT 171   GRANS 51   LYMPH 39   EOS 2   MONOS 7   BASOS 1   IG 1   ANEU 3.2   ABL 2.4   ABE 0.1   ABM 0.5   ABB 0.0   AIG 0.0      LFT Recent Labs     09/07/18  1115   SGOT 178*   ALT 144*   AP 181*   TP 9.3*   ALB 4.2   GLOB 5.1*   AGRAT 0.8*      Cardiac Testing No results found for: BNPP, BNP, CPK, RCK1, RCK2, RCK3, RCK4, CKMB, CKNDX, CKND1, TROPT, TROIQ   Coagulation Tests No results found for: PTP, INR, APTT, INREXT   A1c No results found for: HBA1C, HGBE8, HBA1CEXT   Lipid Panel No results found for: CHOL, CHOLPOCT, CHOLX, CHLST, CHOLV, 884269, HDL, HDLP, LDL, LDLC, DLDLP, 130865, VLDLC, VLDL, TGLX, TRIGL, TRIGP, TGLPOCT, CHHD, CHHDX   Thyroid Panel No results found for: T4, T3U, TSH, TSHEXT     Most Recent UA No results found for: COLOR, APPRN, REFSG, PHU, PROTU, GLUCU, KETU, BILU, BLDU, UROU, NITU, LEUKU     No Known Allergies  Immunization History   Administered Date(s) Administered   ??? Tdap 01/24/2016       All Labs from Last 24 Hrs:  No results found for this or any previous visit (from the past 24 hour(s)).    Discharge Exam:  Patient Vitals for the past 24 hrs:   Temp Pulse Resp BP SpO2   09/09/18 0723 98.2 ??F (36.8 ??C) 91 18 135/89 97 %   09/09/18 0237 98.1 ??F (36.7 ??C) 90 18 (!) 149/110 97 %   09/09/18 0000 97.8 ??F (36.6 ??C) 85 18 (!) 151/102 97 %   09/08/18 2000 98.5 ??F (36.9 ??C) 82 18 (!) 150/104 99 %   09/08/18 1616 98.1 ??F (36.7 ??C) 89 18 (!) 148/96 96 %   09/08/18 1215 98.6 ??F (37 ??C) 90 18 (!) 148/91 96 %     Oxygen Therapy  O2 Sat (%): 97 % (09/09/18 0723)  Pulse via Oximetry: 93 beats per minute (09/07/18 1350)  No intake or output data in the  24 hours ending 09/09/18 1001    General:    Well nourished.  Alert.  No distress.  Eyes:   Normal sclera.  Extraocular movements intact.  ENT:  Normocephalic, atraumatic.  Moist mucous membranes  CV:   Regular rate and rhythm.  No murmur, rub, or gallop.     Lungs:  Clear to auscultation bilaterally.  No wheezing, rhonchi, or rales.  Abdomen: Soft, nontender, nondistended. Bowel sounds normal.   Extremities: Warm and dry.  No cyanosis or edema.  Neurologic: CN II-XII grossly intact.  Sensation intact.  Skin:     No rashes or jaundice.    Psych:  Normal mood and affect.    Discharge Info:   Current Discharge Medication List            Disposition: home    Activity: No Driving at all!!!!!!  Diet: DIET REGULAR    No orders of the defined types were placed in this encounter.        Follow-up Information     Follow up With Specialties Details Why Contact Info    [facility]  Go on 09/16/2018 10:30 AM for setup with free clinic program [address]  [place] [address]    [facility]  [phone]          Time spent in patient discharge planning and coordination 37 minutes.    Signed:  Reatha Armour, MD

## 2018-09-09 NOTE — Progress Notes (Signed)
Problem: Patient Education: Go to Patient Education Activity  Goal: Patient/Family Education  Outcome: Progressing Towards Goal     Problem: Pain  Goal: *Control of Pain  Outcome: Progressing Towards Goal     Problem: Patient Education: Go to Patient Education Activity  Goal: Patient/Family Education  Outcome: Progressing Towards Goal

## 2018-09-16 ENCOUNTER — Inpatient Hospital Stay
Admit: 2018-09-16 | Discharge: 2018-09-16 | Disposition: A | Discharge status: Home / Self Care | Payer: Self-pay | Attending: Emergency Medicine

## 2018-09-16 DIAGNOSIS — R002 Palpitations: Secondary | ICD-10-CM

## 2018-09-16 LAB — COMPREHENSIVE METABOLIC PANEL
ALT: 84 U/L — ABNORMAL HIGH (ref 12–65)
AST: 99 U/L — ABNORMAL HIGH (ref 15–37)
Albumin/Globulin Ratio: 0.8 — ABNORMAL LOW (ref 1.2–3.5)
Albumin: 3.6 g/dL (ref 3.5–5.0)
Alkaline Phosphatase: 122 U/L (ref 50–136)
Anion Gap: 7 mmol/L (ref 7–16)
BUN: 6 MG/DL (ref 6–23)
CO2: 31 mmol/L (ref 21–32)
Calcium: 9.4 MG/DL (ref 8.3–10.4)
Chloride: 100 mmol/L (ref 98–107)
Creatinine: 0.86 MG/DL (ref 0.8–1.5)
EGFR IF NonAfrican American: >60 mL/min/{1.73_m2} (ref >60)
GFR African American: >60 mL/min/{1.73_m2} (ref >60)
Globulin: 4.7 g/dL — ABNORMAL HIGH (ref 2.3–3.5)
Glucose: 81 mg/dL (ref 65–100)
Potassium: 3.3 mmol/L — ABNORMAL LOW (ref 3.5–5.1)
Sodium: 138 mmol/L (ref 136–145)
Total Bilirubin: 1.3 MG/DL — ABNORMAL HIGH (ref 0.2–1.1)
Total Protein: 8.3 g/dL — ABNORMAL HIGH (ref 6.3–8.2)

## 2018-09-16 LAB — CBC WITH AUTO DIFFERENTIAL
Basophils %: 1 % (ref 0.0–2.0)
Basophils Absolute: 0 10*3/uL (ref 0.0–0.2)
Eosinophils %: 1 % (ref 0.5–7.8)
Eosinophils Absolute: 0 10*3/uL (ref 0.0–0.8)
Granulocyte Absolute Count: 0 10*3/uL (ref 0.0–0.5)
Hematocrit: 41.2 % (ref 41.1–50.3)
Hemoglobin: 13.9 g/dL (ref 13.6–17.2)
Immature Granulocytes: 0 % (ref 0.0–5.0)
Lymphocytes %: 17 % (ref 13–44)
Lymphocytes Absolute: 0.6 10*3/uL (ref 0.5–4.6)
MCH: 33.6 PG — ABNORMAL HIGH (ref 26.1–32.9)
MCHC: 33.7 g/dL (ref 31.4–35.0)
MCV: 99.5 FL — ABNORMAL HIGH (ref 79.6–97.8)
MPV: 10 FL (ref 9.4–12.3)
Monocytes %: 15 % — ABNORMAL HIGH (ref 4.0–12.0)
Monocytes Absolute: 0.5 10*3/uL (ref 0.1–1.3)
NRBC Absolute: 0 10*3/uL (ref 0.0–0.2)
Neutrophils %: 66 % (ref 43–78)
Neutrophils Absolute: 2.4 10*3/uL (ref 1.7–8.2)
Platelets: 206 10*3/uL (ref 150–450)
RBC: 4.14 M/uL — ABNORMAL LOW (ref 4.23–5.6)
RDW: 12.5 % (ref 11.9–14.6)
WBC: 3.6 10*3/uL — ABNORMAL LOW (ref 4.3–11.1)

## 2018-09-16 LAB — EKG 12-LEAD
Atrial Rate: 78 {beats}/min
P Axis: 65 degrees
P-R Interval: 138 ms
Q-T Interval: 372 ms
QRS Duration: 94 ms
QTc Calculation (Bazett): 424 ms
R Axis: 86 degrees
T Axis: 34 degrees
Ventricular Rate: 78 {beats}/min

## 2018-09-16 LAB — METABOLIC PANEL, COMPREHENSIVE
A-G Ratio: 0.8 — ABNORMAL LOW (ref 1.2–3.5)
ALT (SGPT): 84 U/L — ABNORMAL HIGH (ref 12–65)
AST (SGOT): 99 U/L — ABNORMAL HIGH (ref 15–37)
Albumin: 3.6 g/dL (ref 3.5–5.0)
Alk. phosphatase: 122 U/L (ref 50–136)
Anion gap: 7 mmol/L (ref 7–16)
BUN: 6 MG/DL (ref 6–23)
Bilirubin, total: 1.3 MG/DL — ABNORMAL HIGH (ref 0.2–1.1)
CO2: 31 mmol/L (ref 21–32)
Calcium: 9.4 MG/DL (ref 8.3–10.4)
Chloride: 100 mmol/L (ref 98–107)
Creatinine: 0.86 MG/DL (ref 0.8–1.5)
GFR est AA: >60 mL/min/{1.73_m2} (ref >60)
GFR est non-AA: >60 mL/min/{1.73_m2} (ref >60)
Globulin: 4.7 g/dL — ABNORMAL HIGH (ref 2.3–3.5)
Glucose: 81 mg/dL (ref 65–100)
Potassium: 3.3 mmol/L — ABNORMAL LOW (ref 3.5–5.1)
Protein, total: 8.3 g/dL — ABNORMAL HIGH (ref 6.3–8.2)
Sodium: 138 mmol/L (ref 136–145)

## 2018-09-16 LAB — CBC WITH AUTOMATED DIFF
ABS. BASOPHILS: 0 10*3/uL (ref 0.0–0.2)
ABS. EOSINOPHILS: 0 10*3/uL (ref 0.0–0.8)
ABS. IMM. GRANS.: 0 10*3/uL (ref 0.0–0.5)
ABS. LYMPHOCYTES: 0.6 10*3/uL (ref 0.5–4.6)
ABS. MONOCYTES: 0.5 10*3/uL (ref 0.1–1.3)
ABS. NEUTROPHILS: 2.4 10*3/uL (ref 1.7–8.2)
ABSOLUTE NRBC: 0 10*3/uL (ref 0.0–0.2)
BASOPHILS: 1 % (ref 0.0–2.0)
EOSINOPHILS: 1 % (ref 0.5–7.8)
HCT: 41.2 % (ref 41.1–50.3)
HGB: 13.9 g/dL (ref 13.6–17.2)
IMMATURE GRANULOCYTES: 0 % (ref 0.0–5.0)
LYMPHOCYTES: 17 % (ref 13–44)
MCH: 33.6 PG — ABNORMAL HIGH (ref 26.1–32.9)
MCHC: 33.7 g/dL (ref 31.4–35.0)
MCV: 99.5 FL — ABNORMAL HIGH (ref 79.6–97.8)
MONOCYTES: 15 % — ABNORMAL HIGH (ref 4.0–12.0)
MPV: 10 FL (ref 9.4–12.3)
NEUTROPHILS: 66 % (ref 43–78)
PLATELET: 206 10*3/uL (ref 150–450)
RBC: 4.14 M/uL — ABNORMAL LOW (ref 4.23–5.6)
RDW: 12.5 % (ref 11.9–14.6)
WBC: 3.6 10*3/uL — ABNORMAL LOW (ref 4.3–11.1)

## 2018-09-16 LAB — EKG, 12 LEAD, INITIAL
Atrial Rate: 78 {beats}/min
Calculated P Axis: 65 degrees
Calculated R Axis: 86 degrees
Calculated T Axis: 34 degrees
P-R Interval: 138 ms
Q-T Interval: 372 ms
QRS Duration: 94 ms
QTC Calculation (Bezet): 424 ms
Ventricular Rate: 78 {beats}/min

## 2018-09-16 MED ORDER — SODIUM CHLORIDE 0.9% BOLUS IV
0.9 % | Freq: Once | INTRAVENOUS | Status: AC
Start: 2018-09-16 — End: 2018-09-16
  Administered 2018-09-16: 14:00:00 via INTRAVENOUS

## 2018-09-16 MED ORDER — ONDANSETRON (PF) 4 MG/2 ML INJECTION
4 mg/2 mL | INTRAMUSCULAR | Status: AC
Start: 2018-09-16 — End: 2018-09-16
  Administered 2018-09-16: 14:00:00 via INTRAVENOUS

## 2018-09-16 MED ORDER — LEVETIRACETAM 500 MG TAB
500 mg | ORAL_TABLET | Freq: Two times a day (BID) | ORAL | 0 refills | Status: DC
Start: 2018-09-16 — End: 2019-01-14

## 2018-09-16 MED ORDER — SODIUM CHLORIDE 0.9 % IV
5005 mg/5 mL | Freq: Once | INTRAVENOUS | Status: AC
Start: 2018-09-16 — End: 2018-09-16
  Administered 2018-09-16: 15:00:00 via INTRAVENOUS

## 2018-09-16 MED FILL — ONDANSETRON (PF) 4 MG/2 ML INJECTION: 4 mg/2 mL | INTRAMUSCULAR | Qty: 2

## 2018-09-16 MED FILL — LEVETIRACETAM 500 MG/5 ML IV SOLN: 500 mg/5 mL | INTRAVENOUS | Qty: 10

## 2018-09-16 NOTE — ED Notes (Signed)
 I have reviewed discharge instructions with the patient.  The patient verbalized understanding.    Patient left ED via Discharge Method: ambulatory to Home with family.    Opportunity for questions and clarification provided.       Patient given 1 scripts. Patient given work excuse.         To continue your aftercare when you leave the hospital, you may receive an automated call from our care team to check in on how you are doing.  This is a free service and part of our promise to provide the best care and service to meet your aftercare needs." If you have questions, or wish to unsubscribe from this service please call [phone].  Thank you for Choosing our [facility] Emergency Department.

## 2018-09-16 NOTE — ED Notes (Signed)
Pt reports hx of seizures with recent admittance. Pt states he has jitters and that he needs to eat but is scared to due to nausea. Pt states they do not know what caused the seizures. Pt reports he is not currently drinking ETOH. Pt states last drink was before he was admitted last. Pt also reports palpitations as well. Pt denies anxiety.     Pt reports he has not had keppra filled so he has not been taking it. Pt denies any pain

## 2018-09-16 NOTE — ED Provider Notes (Signed)
48 year old male returns to the ER with complaints of nausea and palpitations.  Patient was recently admitted in the hospital after an episode of seizures and metabolic acidosis.  Reports that he is not had any alcohol since his last admission.  Says he felt well yesterday but today when he was ready to go back to work I jittery and had the palpitations.  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  Of note he does have an appointment at 1030 this morning with the [facility] to establish primary care.  Patient has been noncompliant with his Keppra and states that he was "going to get it filled today."    The history is provided by the patient.   Nausea    This is a recurrent problem. The current episode started more than 2 days ago. The problem occurs 2 to 4 times per day. The problem has been gradually worsening. There has been no fever. Pertinent negatives include no chills, no fever, no abdominal pain, no diarrhea, no headaches, no arthralgias, no myalgias, no cough and no headaches. The patient is not pregnant.Risk factors include alcohol. His pertinent negatives include no irritable bowel syndrome and no DM.        Past Medical History:   Diagnosis Date   ??? Alcohol abuse 09/07/2018       History reviewed. No pertinent surgical history.      History reviewed. No pertinent family history.    Social History     Socioeconomic History   ??? Marital status: MARRIED     Spouse name: Not on file   ??? Number of children: Not on file   ??? Years of education: Not on file   ??? Highest education level: Not on file   Occupational History   ??? Not on file   Social Needs   ??? Financial resource strain: Not on file   ??? Food insecurity:     Worry: Not on file     Inability: Not on file   ??? Transportation needs:     Medical: Not on file     Non-medical: Not on file   Tobacco Use   ??? Smoking status: Current Every Day Smoker     Packs/day: 1.00     Years: 20.00      Pack years: 20.00   Substance and Sexual Activity   ??? Alcohol use: Yes     Comment: social- a lot of Vodka tonight   ??? Drug use: No   ??? Sexual activity: Yes     Partners: Female   Lifestyle   ??? Physical activity:     Days per week: Not on file     Minutes per session: Not on file   ??? Stress: Not on file   Relationships   ??? Social connections:     Talks on phone: Not on file     Gets together: Not on file     Attends religious service: Not on file     Active member of club or organization: Not on file     Attends meetings of clubs or organizations: Not on file     Relationship status: Not on file   ??? Intimate partner violence:     Fear of current or ex partner: Not on file     Emotionally abused: Not on file     Physically abused: Not on file  Forced sexual activity: Not on file   Other Topics Concern   ??? Not on file   Social History Narrative   ??? Not on file         ALLERGIES: Patient has no known allergies.    Review of Systems   Constitutional: Negative for activity change, chills, diaphoresis and fever.   HENT: Negative for dental problem, hearing loss, nosebleeds, rhinorrhea and sore throat.    Eyes: Negative for pain, discharge, redness and visual disturbance.   Respiratory: Negative for cough, chest tightness and shortness of breath.    Cardiovascular: Positive for palpitations. Negative for chest pain and leg swelling.   Gastrointestinal: Positive for nausea. Negative for abdominal pain, constipation, diarrhea and vomiting.   Endocrine: Negative for cold intolerance, heat intolerance, polydipsia and polyuria.   Genitourinary: Negative for dysuria and flank pain.   Musculoskeletal: Negative for arthralgias, back pain, joint swelling, myalgias and neck pain.   Skin: Negative for pallor and rash.   Allergic/Immunologic: Negative for environmental allergies and food allergies.   Neurological: Negative for dizziness, tremors, light-headedness, numbness and headaches.   Hematological: Negative for adenopathy.  Does not bruise/bleed easily.   Psychiatric/Behavioral: Negative for confusion and dysphoric mood. The patient is not nervous/anxious and is not hyperactive.    All other systems reviewed and are negative.      Vitals:    09/16/18 0806   BP: (!) 169/115   Pulse: 86   Resp: 16   Temp: 97.9 ??F (36.6 ??C)   SpO2: 99%   Weight: 74.8 kg (165 lb)   Height:  (1.727 m)            Physical Exam  Vitals signs and nursing note reviewed.   Constitutional:       General: He is in acute distress.      Appearance: He is well-developed.   HENT:      Head: Normocephalic and atraumatic.      Right Ear: External ear normal.      Left Ear: External ear normal.      Mouth/Throat:      Pharynx: No oropharyngeal exudate.   Eyes:      General: No scleral icterus.     Conjunctiva/sclera: Conjunctivae normal.      Pupils: Pupils are equal, round, and reactive to light.   Neck:      Musculoskeletal: Normal range of motion and neck supple.      Thyroid: No thyromegaly.      Vascular: No JVD.   Cardiovascular:      Rate and Rhythm: Normal rate and regular rhythm.      Heart sounds: Normal heart sounds. No murmur. No friction rub. No gallop.    Pulmonary:      Effort: Pulmonary effort is normal. No respiratory distress.      Breath sounds: Normal breath sounds. No wheezing.   Abdominal:      General: Bowel sounds are normal. There is no distension.      Palpations: Abdomen is soft.      Tenderness: There is no abdominal tenderness.   Musculoskeletal: Normal range of motion.         General: No tenderness or deformity.   Skin:     General: Skin is warm and dry.      Capillary Refill: Capillary refill takes less than 2 seconds.      Findings: No rash.   Neurological:      Mental Status: He is alert  and oriented to person, place, and time.      Cranial Nerves: No cranial nerve deficit.      Sensory: No sensory deficit.      Motor: No abnormal muscle tone.      Coordination: Coordination normal.   Psychiatric:         Mood and Affect: Mood  normal.         Behavior: Behavior normal.         Thought Content: Thought content normal.         Judgment: Judgment normal.          MDM  Number of Diagnoses or Management Options  Palpitations: established and worsening  Seizure disorder (HCC): established and worsening  Diagnosis management comments: EKG reviewed  Sinus rhythm normal intervals normal axis  No ectopy  No acute ischemic changes    Work-up today unremarkable    Patient will be given a dose of IV Keppra until he can fill his prescription later today       Amount and/or Complexity of Data Reviewed  Clinical lab tests: ordered and reviewed  Tests in the medicine section of CPT??: ordered and reviewed  Review and summarize past medical records: yes    Risk of Complications, Morbidity, and/or Mortality  Presenting problems: moderate  Diagnostic procedures: moderate  Management options: moderate  General comments: Elements of this note have been dictated via voice recognition software.  Text and phrases may be limited by the accuracy of the software.  The chart has been reviewed, but errors may still be present.      Patient Progress  Patient progress: improved         Procedures

## 2018-09-16 NOTE — ED Notes (Signed)
I have reviewed discharge instructions with the patient.  The patient verbalized understanding.    Patient left ED via Discharge Method: ambulatory to Home with family.     Opportunity for questions and clarification provided.       Patient given 1 scripts. Patient given work excuse.         To continue your aftercare when you leave the hospital, you may receive an automated call from our care team to check in on how you are doing.  This is a free service and part of our promise to provide the best care and service to meet your aftercare needs.??? If you have questions, or wish to unsubscribe from this service please call [phone].  Thank you for Choosing our [facility] Department.

## 2018-09-16 NOTE — ED Provider Notes (Addendum)
48 year old male returns to the ER with complaints of nausea and palpitations.  Patient was recently admitted in the hospital after an episode of seizures and metabolic acidosis.  Reports that he is not had any alcohol since his last admission.  Says he felt well yesterday but today when he was ready to go back to work I jittery and had the palpitations.  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  Of note he does have an appointment at 1030 this morning with the [facility] to establish primary care.  Patient has been noncompliant with his Keppra and states that he was "going to get it filled today."    The history is provided by the patient.   Nausea    This is a recurrent problem. The current episode started more than 2 days ago. The problem occurs 2 to 4 times per day. The problem has been gradually worsening. There has been no fever. Pertinent negatives include no chills, no fever, no abdominal pain, no diarrhea, no headaches, no arthralgias, no myalgias, no cough and no headaches. The patient is not pregnant.Risk factors include alcohol. His pertinent negatives include no irritable bowel syndrome and no DM.        Past Medical History:   Diagnosis Date   ??? Alcohol abuse 09/07/2018       History reviewed. No pertinent surgical history.      History reviewed. No pertinent family history.    Social History     Socioeconomic History   ??? Marital status: MARRIED     Spouse name: Not on file   ??? Number of children: Not on file   ??? Years of education: Not on file   ??? Highest education level: Not on file   Occupational History   ??? Not on file   Social Needs   ??? Financial resource strain: Not on file   ??? Food insecurity:     Worry: Not on file     Inability: Not on file   ??? Transportation needs:     Medical: Not on file     Non-medical: Not on file   Tobacco Use   ??? Smoking status: Current Every Day Smoker     Packs/day: 1.00     Years: 20.00      Pack years: 20.00   Substance and Sexual Activity   ??? Alcohol use: Yes     Comment: social- a lot of Vodka tonight   ??? Drug use: No   ??? Sexual activity: Yes     Partners: Female   Lifestyle   ??? Physical activity:     Days per week: Not on file     Minutes per session: Not on file   ??? Stress: Not on file   Relationships   ??? Social connections:     Talks on phone: Not on file     Gets together: Not on file     Attends religious service: Not on file     Active member of club or organization: Not on file     Attends meetings of clubs or organizations: Not on file     Relationship status: Not on file   ??? Intimate partner violence:     Fear of current or ex partner: Not on file     Emotionally abused: Not on file     Physically abused: Not on file  Forced sexual activity: Not on file   Other Topics Concern   ??? Not on file   Social History Narrative   ??? Not on file         ALLERGIES: Patient has no known allergies.    Review of Systems   Constitutional: Negative for activity change, chills, diaphoresis and fever.   HENT: Negative for dental problem, hearing loss, nosebleeds, rhinorrhea and sore throat.    Eyes: Negative for pain, discharge, redness and visual disturbance.   Respiratory: Negative for cough, chest tightness and shortness of breath.    Cardiovascular: Positive for palpitations. Negative for chest pain and leg swelling.   Gastrointestinal: Positive for nausea. Negative for abdominal pain, constipation, diarrhea and vomiting.   Endocrine: Negative for cold intolerance, heat intolerance, polydipsia and polyuria.   Genitourinary: Negative for dysuria and flank pain.   Musculoskeletal: Negative for arthralgias, back pain, joint swelling, myalgias and neck pain.   Skin: Negative for pallor and rash.   Allergic/Immunologic: Negative for environmental allergies and food allergies.   Neurological: Negative for dizziness, tremors, light-headedness, numbness and headaches.    Hematological: Negative for adenopathy. Does not bruise/bleed easily.   Psychiatric/Behavioral: Negative for confusion and dysphoric mood. The patient is not nervous/anxious and is not hyperactive.    All other systems reviewed and are negative.      Vitals:    09/16/18 0806   BP: (!) 169/115   Pulse: 86   Resp: 16   Temp: 97.9 ??F (36.6 ??C)   SpO2: 99%   Weight: 74.8 kg (165 lb)   Height: 5\' 8"  (1.727 m)            Physical Exam  Vitals signs and nursing note reviewed.   Constitutional:       General: He is in acute distress.      Appearance: He is well-developed.   HENT:      Head: Normocephalic and atraumatic.      Right Ear: External ear normal.      Left Ear: External ear normal.      Mouth/Throat:      Pharynx: No oropharyngeal exudate.   Eyes:      General: No scleral icterus.     Conjunctiva/sclera: Conjunctivae normal.      Pupils: Pupils are equal, round, and reactive to light.   Neck:      Musculoskeletal: Normal range of motion and neck supple.      Thyroid: No thyromegaly.      Vascular: No JVD.   Cardiovascular:      Rate and Rhythm: Normal rate and regular rhythm.      Heart sounds: Normal heart sounds. No murmur. No friction rub. No gallop.    Pulmonary:      Effort: Pulmonary effort is normal. No respiratory distress.      Breath sounds: Normal breath sounds. No wheezing.   Abdominal:      General: Bowel sounds are normal. There is no distension.      Palpations: Abdomen is soft.      Tenderness: There is no abdominal tenderness.   Musculoskeletal: Normal range of motion.         General: No tenderness or deformity.   Skin:     General: Skin is warm and dry.      Capillary Refill: Capillary refill takes less than 2 seconds.      Findings: No rash.   Neurological:      Mental Status: He is alert  and oriented to person, place, and time.      Cranial Nerves: No cranial nerve deficit.      Sensory: No sensory deficit.      Motor: No abnormal muscle tone.      Coordination: Coordination normal.    Psychiatric:         Mood and Affect: Mood normal.         Behavior: Behavior normal.         Thought Content: Thought content normal.         Judgment: Judgment normal.          MDM  Number of Diagnoses or Management Options  Palpitations: established and worsening  Seizure disorder (HCC): established and worsening  Diagnosis management comments: EKG reviewed  Sinus rhythm normal intervals normal axis  No ectopy  No acute ischemic changes    Work-up today unremarkable    Patient will be given a dose of IV Keppra until he can fill his prescription later today       Amount and/or Complexity of Data Reviewed  Clinical lab tests: ordered and reviewed  Tests in the medicine section of CPT??: ordered and reviewed  Review and summarize past medical records: yes    Risk of Complications, Morbidity, and/or Mortality  Presenting problems: moderate  Diagnostic procedures: moderate  Management options: moderate  General comments: Elements of this note have been dictated via voice recognition software.  Text and phrases may be limited by the accuracy of the software.  The chart has been reviewed, but errors may still be present.      Patient Progress  Patient progress: improved         Procedures

## 2018-09-16 NOTE — ED Triage Notes (Addendum)
Pt reports hx of seizures with recent admittance. Pt states he has jitters and that he needs to eat but is scared to due to nausea. Pt states they do not know what caused the seizures. Pt reports he is not currently drinking ETOH. Pt states last drink was before he was admitted last. Pt also reports palpitations as well. Pt denies anxiety.     Pt reports he has not had keppra filled so he has not been taking it. Pt denies any pain

## 2019-01-14 ENCOUNTER — Inpatient Hospital Stay
Admit: 2019-01-14 | Discharge: 2019-01-14 | Disposition: A | Discharge status: Home / Self Care | Payer: Self-pay | Attending: Emergency Medicine

## 2019-01-14 ENCOUNTER — Emergency Department: Admit: 2019-01-14 | Payer: Self-pay

## 2019-01-14 DIAGNOSIS — R569 Unspecified convulsions: Secondary | ICD-10-CM

## 2019-01-14 MED ORDER — LEVETIRACETAM 500 MG TAB
500 mg | ORAL_TABLET | Freq: Two times a day (BID) | ORAL | 3 refills | Status: DC
Start: 2019-01-14 — End: 2019-01-14

## 2019-01-14 MED ORDER — ACETAMINOPHEN 500 MG TAB
500 mg | ORAL | Status: AC
Start: 2019-01-14 — End: 2019-01-14
  Administered 2019-01-14: 10:00:00 via ORAL

## 2019-01-14 MED ORDER — LEVETIRACETAM 500 MG/5 ML IV SOLN
5005 mg/5 mL | INTRAVENOUS | Status: AC
Start: 2019-01-14 — End: 2019-01-14
  Administered 2019-01-14: 07:00:00 via INTRAVENOUS

## 2019-01-14 MED ORDER — LEVETIRACETAM 500 MG TAB
500 mg | ORAL_TABLET | Freq: Two times a day (BID) | ORAL | 3 refills | Status: AC
Start: 2019-01-14 — End: ?

## 2019-01-14 MED FILL — MAPAP EXTRA STRENGTH 500 MG TABLET: 500 mg | ORAL | Qty: 2

## 2019-01-14 MED FILL — LEVETIRACETAM 500 MG/5 ML IV SOLN: 500 mg/5 mL | INTRAVENOUS | Qty: 10

## 2019-01-14 NOTE — ED Notes (Signed)
 Arrives from home via [facility] with possible seizure like activity. Hx seizures, out of keppra x8 months. Sister denies witnessing seizures however reports witnessed pt of cleaning self of incontinence yesterday. Sister reports leaving house approx 1500, returned 0130 with noted swelling to pts lips, abrasions to nose, left side face/cheek. Bruising with blood noted to tongue. EMS reports sister stated missing teeth however poor dentition. bgl 141 with EMS. Reports pain to tongue, denies other complaints. A/o x3 on arrival.

## 2019-01-14 NOTE — ED Provider Notes (Signed)
ED Provider Notes by Lauris PoagHartzog, Elwanda Moger, MD at 01/14/19 [phone]                Author: Lauris PoagHartzog, Brayley Mackowiak, MD  Service: --  Author Type: Physician       Filed: 01/14/19 0557  Date of Service: 01/14/19 0554  Status: Signed          Editor: Lauris PoagHartzog, Kendy Haston, MD (Physician)               48 year old male history of seizures has not been taking his Keppra was found swelling to his face and abrasion  to his head.         Seizure     This is a new problem. The  current episode started 3 to 5 hours ago. The problem has  not changed since onset. The seizure(s) had no focality. Possible causes include missed seizure meds and missed seizure meds. There has been no fever.              Past Medical History:        Diagnosis  Date         ?  Alcohol abuse  09/07/2018           No past surgical history on file.        No family history on file.        Social History          Socioeconomic History         ?  Marital status:  MARRIED              Spouse name:  Not on file         ?  Number of children:  Not on file     ?  Years of education:  Not on file     ?  Highest education level:  Not on file       Occupational History        ?  Not on file       Social Needs         ?  Financial resource strain:  Not on file        ?  Food insecurity              Worry:  Not on file         Inability:  Not on file        ?  Transportation needs              Medical:  Not on file         Non-medical:  Not on file       Tobacco Use         ?  Smoking status:  Current Every Day Smoker              Packs/day:  1.00         Years:  20.00         Pack years:  20.00       Substance and Sexual Activity         ?  Alcohol use:  Yes             Comment: social- a lot of Vodka tonight         ?  Drug use:  No     ?  Sexual activity:  Yes              Partners:  Female       Lifestyle        ?  Physical activity              Days per week:  Not on file         Minutes per session:  Not on file         ?  Stress:  Not on file       Relationships        ?   Social Health visitor on phone:  Not on file         Gets together:  Not on file         Attends religious service:  Not on file         Active member of club or organization:  Not on file         Attends meetings of clubs or organizations:  Not on file         Relationship status:  Not on file        ?  Intimate partner violence              Fear of current or ex partner:  Not on file         Emotionally abused:  Not on file         Physically abused:  Not on file         Forced sexual activity:  Not on file        Other Topics  Concern        ?  Not on file       Social History Narrative        ?  Not on file              ALLERGIES: Patient has no known allergies.      Review of Systems    Constitutional: Negative.  Negative for activity change.    HENT: Negative.     Eyes: Negative.     Respiratory: Negative.     Cardiovascular: Negative.     Gastrointestinal: Negative.     Genitourinary: Negative.     Musculoskeletal: Negative.     Skin: Negative.     Neurological: Negative.     Psychiatric/Behavioral: Negative.     All other systems reviewed and are negative.           Vitals:             01/14/19 0339  01/14/19 0352  01/14/19 0423  01/14/19 0501           BP:  117/73    121/78  121/77     Pulse:  95  97  97  90     Resp:             Temp:             SpO2:  98%  98%  97%  98%     Weight:                   Height:                        Physical Exam   Vitals signs and nursing note reviewed.   Constitutional:        General: He is not in acute distress.     Appearance:  He is well-developed. He is not diaphoretic.    HENT:       Head: Normocephalic. Abrasion and contusion  present.          Right Ear: External ear normal.      Left Ear: External ear normal.      Nose: Nose normal.      Mouth/Throat:      Pharynx: No oropharyngeal exudate.   Eyes:       General: No scleral icterus.        Right eye: No discharge.         Left eye: No discharge.      Conjunctiva/sclera: Conjunctivae normal.       Pupils: Pupils are equal, round, and reactive to light.   Neck:       Musculoskeletal: Normal range of motion and neck supple.      Vascular: No JVD.      Trachea: No tracheal deviation.    Cardiovascular:       Rate and Rhythm: Normal rate and regular rhythm.   Pulmonary :       Effort: Pulmonary effort is normal. No respiratory distress.      Breath sounds: Normal breath sounds. No stridor. No wheezing.   Chest:       Chest wall: No tenderness.   Abdominal :      General: Bowel sounds are normal. There is no distension.      Palpations: Abdomen is soft. There is no mass.      Tenderness: There is no abdominal tenderness.     Musculoskeletal: Normal range of motion.          General: No tenderness.    Skin:      General: Skin is warm and dry.      Coloration: Skin is not pale.      Findings: No erythema or rash.    Neurological:       Mental Status: He is alert and oriented to person, place, and time.      Cranial Nerves: No cranial nerve deficit.    Psychiatric:         Behavior: Behavior normal.         Thought Content: Thought content normal.              MDM   Number of Diagnoses or Management Options   Noncompliance: new  and requires workup   Seizure [facility](HCC): new and requires workup   Diagnosis management comments: CT head and face negative for acute injury other than a nondisplaced nasal bone fracture.  Patient given a gram of Keppra and will renew his prescription          Amount and/or Complexity of Data Reviewed   Clinical lab tests: ordered and reviewed    Tests in the medicine section of CPT??: ordered and reviewed                Procedures

## 2019-01-14 NOTE — ED Notes (Signed)
 I have reviewed discharge instructions with the patient.  The patient verbalized understanding.    Patient left ED via Discharge Method: ambulatory to Home.    Opportunity for questions and clarification provided.       Patient given 1 scripts.         To continue your aftercare when you leave the hospital, you may receive an automated call from our care team to check in on how you are doing.  This is a free service and part of our promise to provide the best care and service to meet your aftercare needs." If you have questions, or wish to unsubscribe from this service please call [phone].  Thank you for Choosing our [facility] Emergency Department.

## 2019-01-14 NOTE — ED Triage Notes (Signed)
Arrives from home via [facility] with possible seizure like activity. Hx seizures, out of keppra x8 months. Sister denies witnessing seizures however reports witnessed pt of cleaning self of incontinence yesterday. Sister reports leaving house approx 1500, returned 0130 with noted swelling to pts lips, abrasions to nose, left side face/cheek. Bruising with blood noted to tongue. EMS reports sister stated "missing teeth" however poor dentition. bgl [id number] with EMS. Reports pain to tongue, denies other complaints. A/o x3 on arrival.

## 2019-01-14 NOTE — ED Provider Notes (Signed)
48 year old male history of seizures has not been taking his Keppra was found swelling to his face and abrasion to his head.      Seizure    This is a new problem. The current episode started 3 to 5 hours ago. The problem has not changed since onset. The seizure(s) had no focality. Possible causes include missed seizure meds and missed seizure meds. There has been no fever.         Past Medical History:   Diagnosis Date   ??? Alcohol abuse 09/07/2018       No past surgical history on file.      No family history on file.    Social History     Socioeconomic History   ??? Marital status: MARRIED     Spouse name: Not on file   ??? Number of children: Not on file   ??? Years of education: Not on file   ??? Highest education level: Not on file   Occupational History   ??? Not on file   Social Needs   ??? Financial resource strain: Not on file   ??? Food insecurity     Worry: Not on file     Inability: Not on file   ??? Transportation needs     Medical: Not on file     Non-medical: Not on file   Tobacco Use   ??? Smoking status: Current Every Day Smoker     Packs/day: 1.00     Years: 20.00     Pack years: 20.00   Substance and Sexual Activity   ??? Alcohol use: Yes     Comment: social- a lot of Vodka tonight   ??? Drug use: No   ??? Sexual activity: Yes     Partners: Female   Lifestyle   ??? Physical activity     Days per week: Not on file     Minutes per session: Not on file   ??? Stress: Not on file   Relationships   ??? 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 Intimate partner violence     Fear of current or ex partner: Not on file     Emotionally abused: Not on file     Physically abused: Not on file     Forced sexual activity: Not on file   Other Topics Concern   ??? Not on file   Social History Narrative    ??? Not on file         ALLERGIES: Patient has no known allergies.    Review of Systems   Constitutional: Negative.  Negative for activity change.   HENT: Negative.    Eyes: Negative.    Respiratory: Negative.    Cardiovascular: Negative.    Gastrointestinal: Negative.    Genitourinary: Negative.    Musculoskeletal: Negative.    Skin: Negative.    Neurological: Negative.    Psychiatric/Behavioral: Negative.    All other systems reviewed and are negative.      Vitals:    01/14/19 0339 01/14/19 0352 01/14/19 0423 01/14/19 0501   BP: 117/73  121/78 121/77   Pulse: 95 97  97 90   Resp:       Temp:       SpO2: 98% 98% 97% 98%   Weight:       Height:                Physical Exam  Vitals signs and nursing note reviewed.   Constitutional:       General: He is not in acute distress.     Appearance: He is well-developed. He is not diaphoretic.   HENT:      Head: Normocephalic. Abrasion and contusion present.        Right Ear: External ear normal.      Left Ear: External ear normal.      Nose: Nose normal.      Mouth/Throat:      Pharynx: No oropharyngeal exudate.   Eyes:      General: No scleral icterus.        Right eye: No discharge.         Left eye: No discharge.      Conjunctiva/sclera: Conjunctivae normal.      Pupils: Pupils are equal, round, and reactive to light.   Neck:      Musculoskeletal: Normal range of motion and neck supple.      Vascular: No JVD.      Trachea: No tracheal deviation.   Cardiovascular:      Rate and Rhythm: Normal rate and regular rhythm.   Pulmonary:      Effort: Pulmonary effort is normal. No respiratory distress.      Breath sounds: Normal breath sounds. No stridor. No wheezing.   Chest:      Chest wall: No tenderness.   Abdominal:      General: Bowel sounds are normal. There is no distension.      Palpations: Abdomen is soft. There is no mass.      Tenderness: There is no abdominal tenderness.   Musculoskeletal: Normal range of motion.         General: No tenderness.   Skin:      General: Skin is warm and dry.      Coloration: Skin is not pale.      Findings: No erythema or rash.   Neurological:      Mental Status: He is alert and oriented to person, place, and time.      Cranial Nerves: No cranial nerve deficit.   Psychiatric:         Behavior: Behavior normal.         Thought Content: Thought content normal.          MDM  Number of Diagnoses or Management Options  Noncompliance: new and requires workup  Seizure [facility]): new and requires workup  Diagnosis management comments: CT head and face negative for acute injury other than a nondisplaced nasal bone fracture.  Patient given a gram of Keppra and will renew his prescription       Amount and/or Complexity of Data Reviewed  Clinical lab tests: ordered and reviewed  Tests in the medicine section of CPT??: ordered and reviewed           Procedures

## 2019-01-14 NOTE — ED Notes (Signed)
I have reviewed discharge instructions with the patient.  The patient verbalized understanding.    Patient left ED via Discharge Method: ambulatory to Home .    Opportunity for questions and clarification provided.       Patient given 1 scripts.         To continue your aftercare when you leave the hospital, you may receive an automated call from our care team to check in on how you are doing.  This is a free service and part of our promise to provide the best care and service to meet your aftercare needs.??? If you have questions, or wish to unsubscribe from this service please call [phone].  Thank you for Choosing our [facility] Emergency Department.

## 2020-05-29 ENCOUNTER — Emergency Department (HOSPITAL_COMMUNITY)
Admission: EM | Admit: 2020-05-29 | Discharge: 2020-05-29 | Disposition: A | Discharge status: Home / Self Care | Payer: Worker's Compensation | Attending: Emergency Medicine | Admitting: Emergency Medicine

## 2020-05-29 ENCOUNTER — Emergency Department (HOSPITAL_COMMUNITY): Payer: Worker's Compensation

## 2020-05-29 ENCOUNTER — Other Ambulatory Visit: Payer: Self-pay

## 2020-05-29 ENCOUNTER — Encounter (HOSPITAL_COMMUNITY): Payer: Self-pay | Admitting: Emergency Medicine

## 2020-05-29 DIAGNOSIS — W01198A Fall on same level from slipping, tripping and stumbling with subsequent striking against other object, initial encounter: Secondary | ICD-10-CM | POA: Insufficient documentation

## 2020-05-29 DIAGNOSIS — Y990 Civilian activity done for income or pay: Secondary | ICD-10-CM | POA: Insufficient documentation

## 2020-05-29 DIAGNOSIS — M25531 Pain in right wrist: Secondary | ICD-10-CM

## 2020-05-29 DIAGNOSIS — W19XXXA Unspecified fall, initial encounter: Secondary | ICD-10-CM

## 2020-05-29 DIAGNOSIS — M25561 Pain in right knee: Secondary | ICD-10-CM | POA: Insufficient documentation

## 2020-05-29 HISTORY — DX: Unspecified convulsions: R56.9

## 2020-05-29 MED ORDER — HYDROCODONE-ACETAMINOPHEN 5-325 MG PO TABS
1.0000 | ORAL_TABLET | Freq: Once | ORAL | Status: AC
  Administered 2020-05-29: 1 via ORAL
  Filled 2020-05-29: qty 1

## 2020-05-29 MED ORDER — HYDROCODONE-ACETAMINOPHEN 5-325 MG PO TABS
1.0000 | ORAL_TABLET | ORAL | 0 refills | Status: AC | PRN
Start: 2020-05-29 — End: ?

## 2020-05-29 NOTE — ED Triage Notes (Signed)
Patient reports trip and fall at work yesterday morning. C/o pain to right arm and leg. Ambulatory without difficulty.

## 2020-05-29 NOTE — Discharge Instructions (Addendum)
Your x-rays did not show any evidence of broken bones however they did state you had some irregularity to your right knee and they are recommending outpatient MRI.  I have placed you in a Velcro splint to your right hand due to pain in your wrist.  Is important you follow-up due to a possible missed fracture on x-ray today.

## 2020-05-29 NOTE — ED Provider Notes (Signed)
[facility]-EMERGENCY DEPT Provider Note   CSN: [id number] Arrival date & time: 05/29/20  1056    History Chief Complaint  Patient presents with  . Fall    Ryan Phillips is a 49 y.o. male with past medical history significant for seizures.  Denies any seizure-like activity.  Stepped off of palette at work yesertday. Landed on right outstretch hand and right knee. Denies hitting head or LOC Pain worse with ambulation to right knee, Feels swollen to right lateral aspect knee Ice, Ibuprofen and rubbing EtOh on knee Pain 8/10.  Denies hitting head, LOC or anticoagulation.  Denies fever, chills, nausea vomiting, headache, lightheadedness, dizziness, chest pain, shortness of breath, abdominal pain.  Denies additional aggravating or alleviating factors.  History of pain from patient past medical records pain interpreter used.  HPI     Past Medical History:  Diagnosis Date  . Seizures (HCC)     There are no problems to display for this patient.   History reviewed. No pertinent surgical history.     No family history on file.  Social History   Tobacco Use  . Smoking status: Not on file  Substance Use Topics  . Alcohol use: Not on file  . Drug use: Not on file    Home Medications Prior to Admission medications   Medication Sig Start Date End Date Taking? Authorizing Provider  HYDROcodone-acetaminophen (NORCO/VICODIN) 5-325 MG tablet Take 1 tablet by mouth every 4 (four) hours as needed. 05/29/20   Marletta Bousquet A, PA-C    Allergies    Patient has no known allergies.  Review of Systems   Review of Systems  Constitutional: Negative.   HENT: Negative.   Respiratory: Negative.   Cardiovascular: Negative.   Gastrointestinal: Negative.   Genitourinary: Negative.   Musculoskeletal: Positive for gait problem. Negative for arthralgias, back pain, joint swelling, myalgias, neck pain and neck stiffness.       Right hand, right knee  Skin:  Negative.   All other systems reviewed and are negative.   Physical Exam Updated Vital Signs BP (!) 157/106   Pulse 92   Temp 98.5 F (36.9 C)   Resp 17   SpO2 100%   Physical Exam Vitals and nursing note reviewed.  Constitutional:      General: He is not in acute distress.    Appearance: He is well-developed. He is not ill-appearing, toxic-appearing or diaphoretic.  HENT:     Head: Normocephalic and atraumatic.     Mouth/Throat:     Mouth: Mucous membranes are moist.  Eyes:     Pupils: Pupils are equal, round, and reactive to light.  Cardiovascular:     Rate and Rhythm: Normal rate and regular rhythm.     Pulses: Normal pulses.          Dorsalis pedis pulses are 2+ on the right side and 2+ on the left side.       Posterior tibial pulses are 2+ on the right side and 2+ on the left side.     Heart sounds: Normal heart sounds.  Pulmonary:     Effort: Pulmonary effort is normal. No respiratory distress.     Breath sounds: Normal breath sounds.  Abdominal:     General: Bowel sounds are normal. There is no distension.     Palpations: Abdomen is soft.  Musculoskeletal:        General: Normal range of motion.     Cervical back: Normal range of motion and neck  supple.     Comments: No midline spinal tenderness, crepitus or step-offs.  Full range of motion all 4 extremities without difficulty.  Tenderness to right scaphoid.  Equal handgrip.  Able to flex and extend.  Tenderness to right lateral joint line.  Some soft tissue swelling.  Able to straight leg raise bilaterally.  Able to flex and extend.  No bony tenderness of bilateral tib-fib, femur.  Wiggles toes without difficulty.  Skin:    General: Skin is warm and dry.     Capillary Refill: Capillary refill takes less than 2 seconds.     Comments: No edema, warmth, fluctuance or induration.  Neurological:     General: No focal deficit present.     Mental Status: He is alert.     Cranial Nerves: Cranial nerves are intact.      Sensory: Sensation is intact.     Motor: Motor function is intact.     Comments: CN 2 through 12 grossly intact Intact sensation 5/5 strength bilateral upper and lower extremities Ambulatory with limp to right knee.     ED Results / Procedures / Treatments   Labs (all labs ordered are listed, but only abnormal results are displayed) Labs Reviewed - No data to display  EKG None  Radiology DG Forearm Right  Result Date: 05/29/2020 CLINICAL DATA:  Fall EXAM: RIGHT WRIST - COMPLETE 3+ VIEW; RIGHT FOREARM - 2 VIEW COMPARISON:  None. FINDINGS: No acute fracture or dislocation. Mild degenerative changes of the first CMC. No area of erosion or osseous destruction. Punctate radiopaque densities along the radial aspect of the wrist likely reflecting early vascular calcifications versus soft tissue abrasion. Soft tissues are otherwise unremarkable. IMPRESSION: 1. No acute fracture or dislocation. If persistent clinical concern for scaphoid fracture, recommend immobilization and follow-up radiographs in 2 weeks versus MRI. Electronically Signed   By: Meda Klinefelter MD   On: 05/29/2020 13:23   DG Wrist Complete Right  Result Date: 05/29/2020 CLINICAL DATA:  Fall EXAM: RIGHT WRIST - COMPLETE 3+ VIEW; RIGHT FOREARM - 2 VIEW COMPARISON:  None. FINDINGS: No acute fracture or dislocation. Mild degenerative changes of the first CMC. No area of erosion or osseous destruction. Punctate radiopaque densities along the radial aspect of the wrist likely reflecting early vascular calcifications versus soft tissue abrasion. Soft tissues are otherwise unremarkable. IMPRESSION: 1. No acute fracture or dislocation. If persistent clinical concern for scaphoid fracture, recommend immobilization and follow-up radiographs in 2 weeks versus MRI. Electronically Signed   By: Meda Klinefelter MD   On: 05/29/2020 13:23   DG Knee Complete 4 Views Right  Result Date: 05/29/2020 CLINICAL DATA:  Fall EXAM: RIGHT KNEE -  COMPLETE 4+ VIEW COMPARISON:  None. FINDINGS: No acute fracture or dislocation. Joint spaces and alignment are maintained. Asymmetric cortical thickening along the medial aspect of the distal femoral shaft. No unexpected radiopaque foreign body. Vascular calcifications. IMPRESSION: 1. No acute fracture or dislocation. 2. Asymmetric cortical thickening along the medial aspect of the distal femoral shaft. Findings may represent sequela of melorheostosis versus stress reaction. Recommend comparison with any available outside priors. If none are available and there is concern for stress reaction, consider further evaluation with nonemergent dedicated MRI. Electronically Signed   By: Meda Klinefelter MD   On: 05/29/2020 13:36    Procedures .Splint Application  Date/Time: 05/29/2020 2:30 PM Performed by: Linwood Dibbles, PA-C Authorized by: Linwood Dibbles, PA-C   Consent:    Consent obtained:  Verbal  Consent given by:  Patient   Risks discussed:  Discoloration, numbness, pain and swelling   Alternatives discussed:  Referral, observation, alternative treatment, delayed treatment and no treatment Pre-procedure details:    Sensation:  Normal Procedure details:    Laterality:  Right   Location:  Wrist   Wrist:  R wrist   Strapping: no     Cast type:  Short arm   Splint type:  Thumb spica   Supplies used: Velcro. Post-procedure details:    Pain:  Improved   Sensation:  Normal   Patient tolerance of procedure:  Tolerated well, no immediate complications .Ortho Injury Treatment  Date/Time: 05/29/2020 2:30 PM Performed by: Ralph Leyden A, PA-C Authorized by: Linwood Dibbles, PA-C   Consent:    Consent obtained:  Verbal   Consent given by:  Patient   Risks discussed:  Fracture, nerve damage, restricted joint movement, vascular damage, stiffness, recurrent dislocation and irreducible dislocation   Alternatives discussed:  No treatmentInjury location: knee Location details:  left knee Injury type: soft tissue Pre-procedure neurovascular assessment: neurovascularly intact Pre-procedure distal perfusion: normal Pre-procedure neurological function: normal Pre-procedure range of motion: normal  Anesthesia: Local anesthesia used: no  Patient sedated: NoImmobilization: brace Post-procedure neurovascular assessment: post-procedure neurovascularly intact Post-procedure distal perfusion: normal Post-procedure neurological function: normal Post-procedure range of motion: normal Patient tolerance: patient tolerated the procedure well with no immediate complications    (including critical care time)  Medications Ordered in ED Medications  HYDROcodone-acetaminophen (NORCO/VICODIN) 5-325 MG per tablet 1 tablet (1 tablet Oral Given 05/29/20 1200)    ED Course  I have reviewed the triage vital signs and the nursing notes.  Pertinent labs & imaging results that were available during my care of the patient were reviewed by me and considered in my medical decision making (see chart for details).  49 year old presents for evaluation after mechanical fall which occurred yesterday.  Pain to right scaphoid, right knee.  Pain worse with ambulation.  Denies hitting head, LOC, anticoagulation.  He is afebrile, nonseptic, non-ill-appearing.  Pain to right scaphoid however full range of motion.  Neurovascularly intact.  Pain to right lateral joint line however full range of motion.  Some mild soft tissue swelling.  No erythema, warmth.  Able to straight leg raise bilaterally.  Negative varus and valgus transfer negative anterior drawer.  Ambulatory limp to right knee.  No bony tenderness of bilateral pelvis, femur, tib-fib.   X-ray right knee with possible cortical irregularity recommend follow-up outpatient with nonemergent MRI X-ray right wrist, tib-fib with out acute fracture, dislocation.  Given pain at his right scaphoid was placed in a thumb spica splint, Velcro as well as  right knee sleeve.  Discussed pain management, ice, elevation.  He will follow outpatient with orthopedics.  Low suspicion for septic joint, gout, hemarthrosis, dislocation, myositis, rhabdomyolysis, VTE. NV intact.  The patient has been appropriately medically screened and/or stabilized in the ED. I have low suspicion for any other emergent medical condition which would require further screening, evaluation or treatment in the ED or require inpatient management.  Patient is hemodynamically stable and in no acute distress.  Patient able to ambulate in department prior to ED.  Evaluation does not show acute pathology that would require ongoing or additional emergent interventions while in the emergency department or further inpatient treatment.  I have discussed the diagnosis with the patient and answered all questions.  Pain is been managed while in the emergency department and patient has no further complaints prior  to discharge.  Patient is comfortable with plan discussed in room and is stable for discharge at this time.  I have discussed strict return precautions for returning to the emergency department.  Patient was encouraged to follow-up with PCP/specialist refer to at discharge.    MDM Rules/Calculators/A&P                           Final Clinical Impression(s) / ED Diagnoses Final diagnoses:  Fall, initial encounter  Right wrist pain  Acute pain of right knee    Rx / DC Orders ED Discharge Orders         Ordered    HYDROcodone-acetaminophen (NORCO/VICODIN) 5-325 MG tablet  Every 4 hours PRN        05/29/20 1425           Hakeen Shipes A, PA-C 05/29/20 1431    Lorre NickAllen, Anthony, MD 06/03/20 1351

## 2022-06-12 IMAGING — CR DG KNEE COMPLETE 4+V*R*
4 series · 4 of 4 positions shown · non-contrast
Comparison: None.

CLINICAL DATA: Fall

EXAM:
RIGHT KNEE - COMPLETE 4+ VIEW

[t knee ap right]
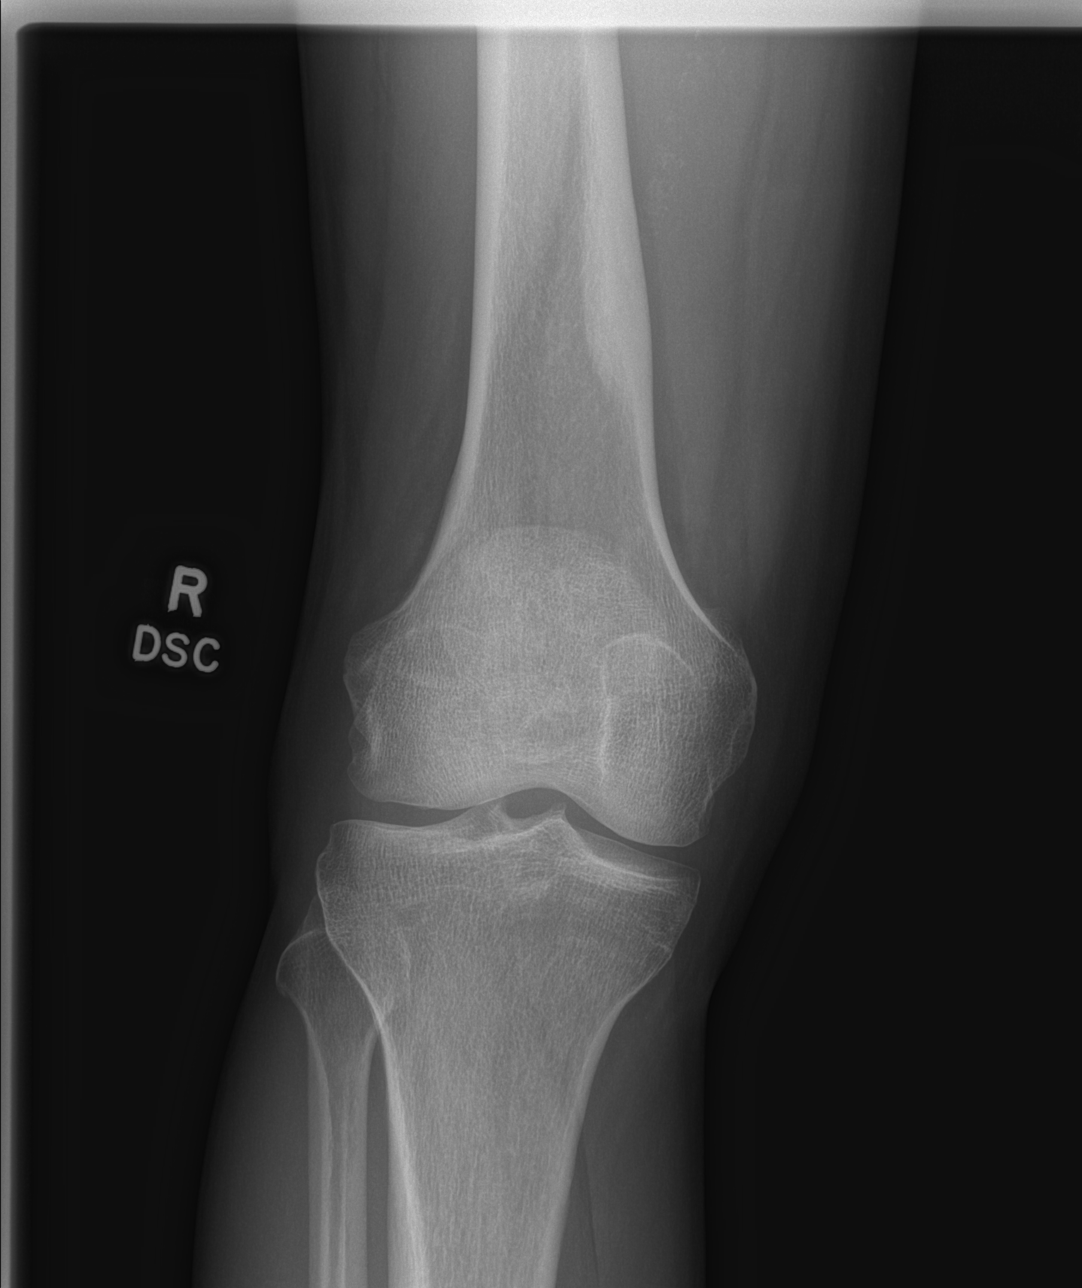

[t knee obl right (1 of 2)]
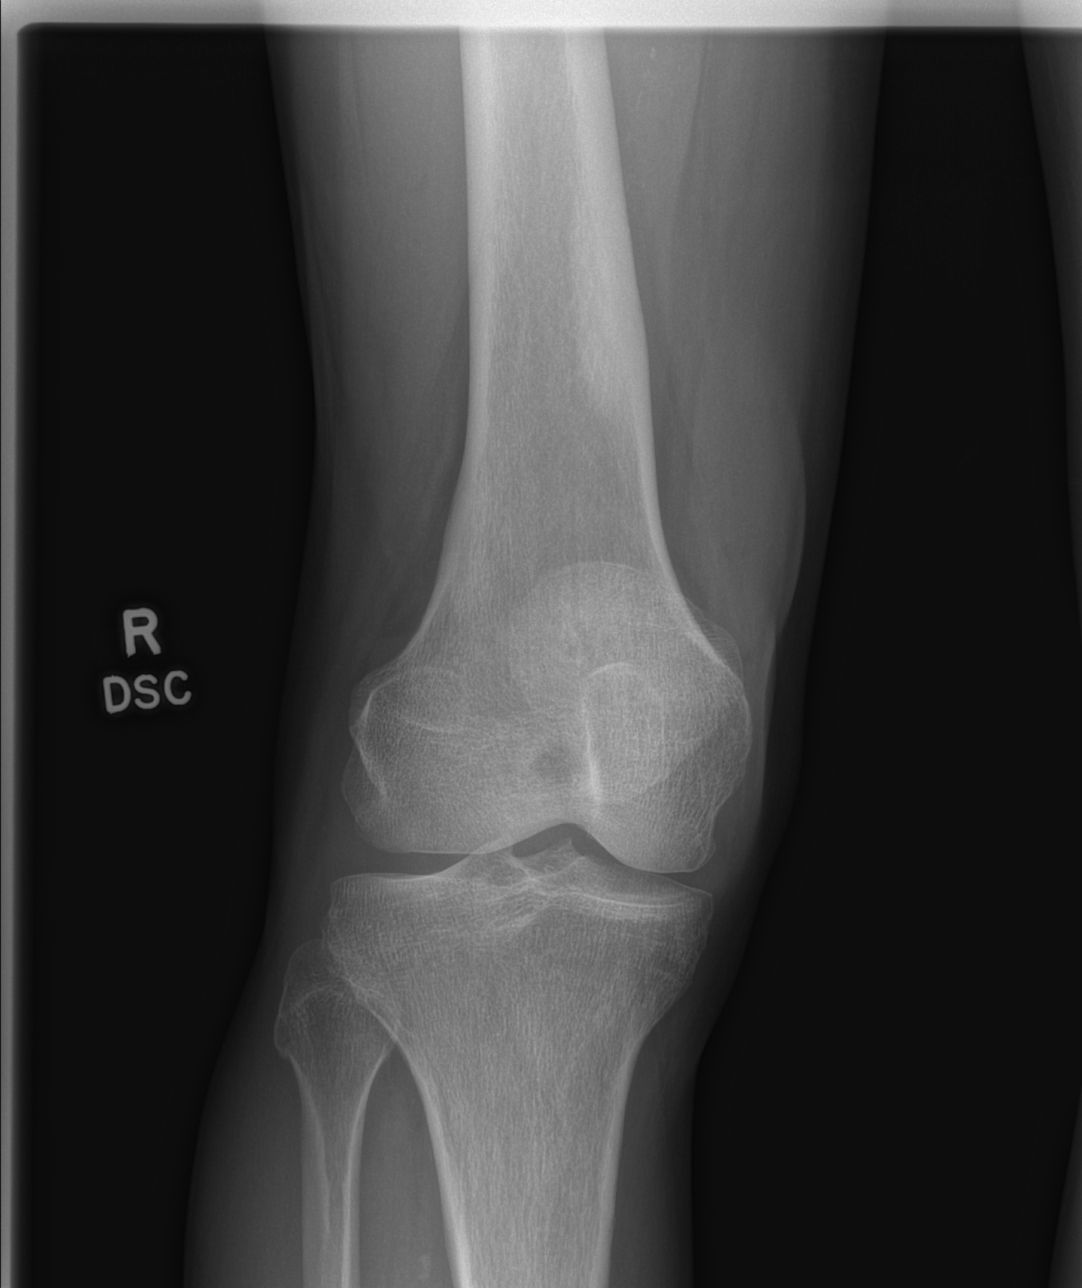

[t knee obl right (2 of 2)]
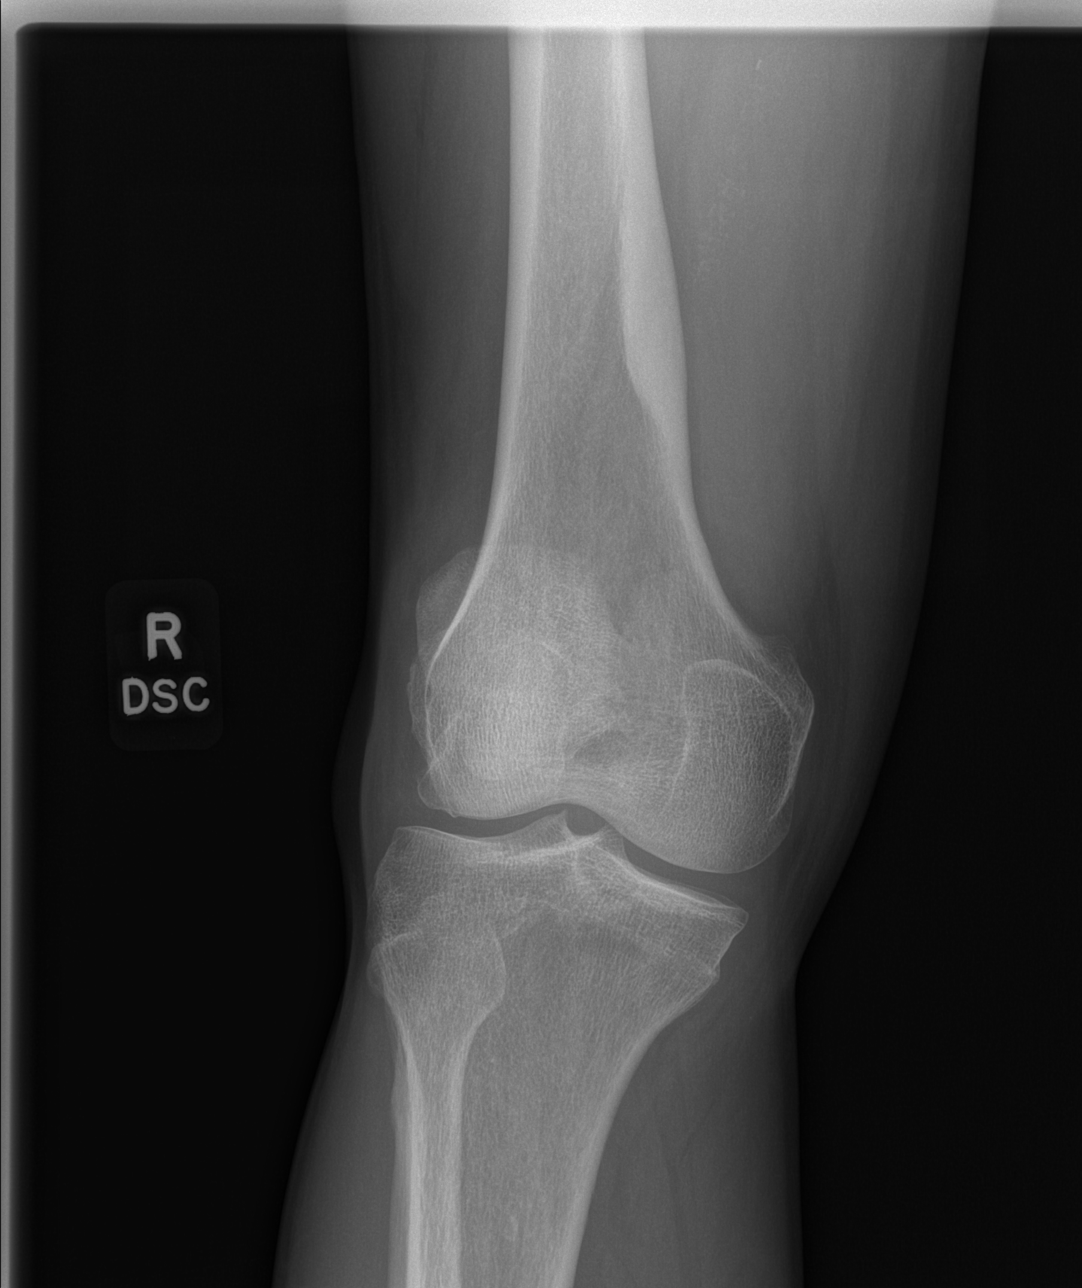

[t knee lat right]
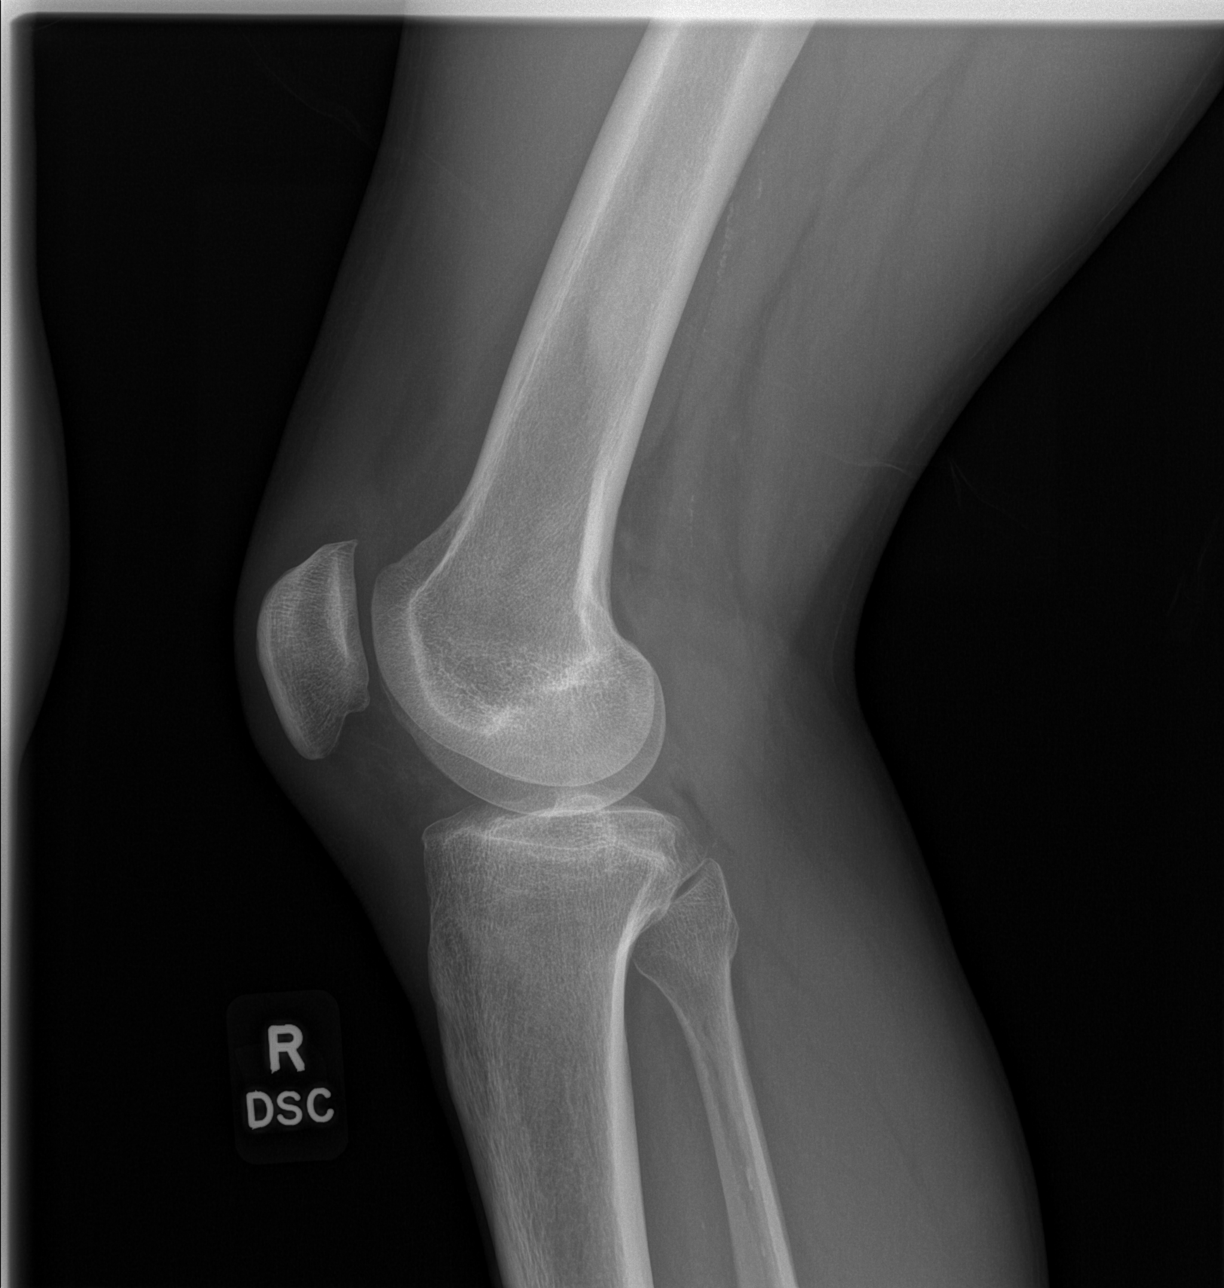

[4 of 4 positions shown; findings below may reference images not displayed]

FINDINGS: No acute fracture or dislocation. Joint spaces and alignment are
maintained. Asymmetric cortical thickening along the medial aspect
of the distal femoral shaft. No unexpected radiopaque foreign body.
Vascular calcifications.
IMPRESSION: 1. No acute fracture or dislocation.
2. Asymmetric cortical thickening along the medial aspect of the
distal femoral shaft. Findings may represent sequela of
melorheostosis versus stress reaction. Recommend comparison with any
available outside priors. If none are available and there is concern
for stress reaction, consider further evaluation with nonemergent
dedicated MRI.
